# Patient Record
Sex: Male | Born: 1955 | Race: White | Hispanic: No | Marital: Single | Smoking: Never smoker
Health system: Southern US, Community
[De-identification: ages and names within clinical notes are randomized; demographics above are authoritative.]

## PROBLEM LIST (undated history)

## (undated) DIAGNOSIS — F429 Obsessive-compulsive disorder, unspecified: Secondary | ICD-10-CM

## (undated) DIAGNOSIS — R569 Unspecified convulsions: Secondary | ICD-10-CM

## (undated) DIAGNOSIS — F32A Depression, unspecified: Secondary | ICD-10-CM

## (undated) DIAGNOSIS — E785 Hyperlipidemia, unspecified: Secondary | ICD-10-CM

## (undated) DIAGNOSIS — I10 Essential (primary) hypertension: Secondary | ICD-10-CM

## (undated) DIAGNOSIS — F419 Anxiety disorder, unspecified: Secondary | ICD-10-CM

## (undated) DIAGNOSIS — F329 Major depressive disorder, single episode, unspecified: Secondary | ICD-10-CM

## (undated) DIAGNOSIS — E119 Type 2 diabetes mellitus without complications: Secondary | ICD-10-CM

## (undated) HISTORY — DX: Hyperlipidemia, unspecified: E78.5

## (undated) HISTORY — DX: Anxiety disorder, unspecified: F41.9

## (undated) HISTORY — DX: Major depressive disorder, single episode, unspecified: F32.9

## (undated) HISTORY — DX: Depression, unspecified: F32.A

## (undated) HISTORY — DX: Type 2 diabetes mellitus without complications: E11.9

## (undated) HISTORY — PX: TONSILLECTOMY: SUR1361

---

## 2000-07-18 ENCOUNTER — Ambulatory Visit (HOSPITAL_COMMUNITY): Admission: RE | Admit: 2000-07-18 | Discharge: 2000-07-18 | Payer: Self-pay | Admitting: Neurology

## 2011-01-17 ENCOUNTER — Encounter: Payer: Self-pay | Admitting: Neurology

## 2014-06-06 ENCOUNTER — Encounter (HOSPITAL_COMMUNITY): Payer: Self-pay | Admitting: Emergency Medicine

## 2014-06-06 ENCOUNTER — Emergency Department (HOSPITAL_COMMUNITY)
Admission: EM | Admit: 2014-06-06 | Discharge: 2014-06-07 | Disposition: A | Discharge status: Home / Self Care | Payer: BC Managed Care – PPO | Attending: Emergency Medicine | Admitting: Emergency Medicine

## 2014-06-06 DIAGNOSIS — Y9389 Activity, other specified: Secondary | ICD-10-CM | POA: Insufficient documentation

## 2014-06-06 DIAGNOSIS — S0990XA Unspecified injury of head, initial encounter: Secondary | ICD-10-CM | POA: Insufficient documentation

## 2014-06-06 DIAGNOSIS — G44209 Tension-type headache, unspecified, not intractable: Secondary | ICD-10-CM

## 2014-06-06 DIAGNOSIS — Z8669 Personal history of other diseases of the nervous system and sense organs: Secondary | ICD-10-CM | POA: Insufficient documentation

## 2014-06-06 DIAGNOSIS — I10 Essential (primary) hypertension: Secondary | ICD-10-CM | POA: Insufficient documentation

## 2014-06-06 DIAGNOSIS — Y92410 Unspecified street and highway as the place of occurrence of the external cause: Secondary | ICD-10-CM | POA: Insufficient documentation

## 2014-06-06 DIAGNOSIS — Z79899 Other long term (current) drug therapy: Secondary | ICD-10-CM | POA: Insufficient documentation

## 2014-06-06 HISTORY — DX: Essential (primary) hypertension: I10

## 2014-06-06 HISTORY — DX: Unspecified convulsions: R56.9
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
## 2014-06-07 ENCOUNTER — Encounter (HOSPITAL_COMMUNITY): Payer: Self-pay | Admitting: *Deleted

## 2014-06-07 ENCOUNTER — Emergency Department (HOSPITAL_COMMUNITY): Payer: BC Managed Care – PPO

## 2014-06-07 MED ORDER — SODIUM CHLORIDE 0.9 % IV SOLN: 1000.0000 mL | INTRAVENOUS | Status: DC

## 2014-06-07 MED ORDER — METOCLOPRAMIDE HCL 5 MG/ML IJ SOLN
10.0000 mg | Freq: Once | INTRAMUSCULAR | Status: AC
  Administered 2014-06-07: 10 mg via INTRAVENOUS
  Filled 2014-06-07: qty 2

## 2014-06-07 MED ORDER — SODIUM CHLORIDE 0.9 % IV SOLN
1000.0000 mL | Freq: Once | INTRAVENOUS | Status: AC
  Administered 2014-06-07: 1000 mL via INTRAVENOUS

## 2014-06-07 MED ORDER — TRAMADOL HCL 50 MG PO TABS: 50.0000 mg | ORAL_TABLET | Freq: Four times a day (QID) | ORAL | Status: DC | PRN

## 2014-06-07 MED ORDER — DIPHENHYDRAMINE HCL 50 MG/ML IJ SOLN
25.0000 mg | Freq: Once | INTRAMUSCULAR | Status: AC
  Administered 2014-06-07: 25 mg via INTRAVENOUS
  Filled 2014-06-07: qty 1

## 2014-06-07 NOTE — ED Notes (Signed)
Removed IV. Saline Locked, Clean Dry and Intact.
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
## 2014-06-07 NOTE — ED Notes (Signed)
Pt refusing wheelchair at discharge.
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
## 2015-07-25 ENCOUNTER — Ambulatory Visit (INDEPENDENT_AMBULATORY_CARE_PROVIDER_SITE_OTHER): Payer: 59 | Admitting: Licensed Clinical Social Worker

## 2015-07-25 DIAGNOSIS — F4323 Adjustment disorder with mixed anxiety and depressed mood: Secondary | ICD-10-CM

## 2015-08-15 ENCOUNTER — Ambulatory Visit (INDEPENDENT_AMBULATORY_CARE_PROVIDER_SITE_OTHER): Payer: 59 | Admitting: Licensed Clinical Social Worker

## 2015-08-15 DIAGNOSIS — F4323 Adjustment disorder with mixed anxiety and depressed mood: Secondary | ICD-10-CM | POA: Diagnosis not present

## 2015-09-03 ENCOUNTER — Ambulatory Visit (INDEPENDENT_AMBULATORY_CARE_PROVIDER_SITE_OTHER): Payer: 59 | Admitting: Licensed Clinical Social Worker

## 2015-09-03 DIAGNOSIS — F4323 Adjustment disorder with mixed anxiety and depressed mood: Secondary | ICD-10-CM | POA: Diagnosis not present

## 2015-09-26 ENCOUNTER — Ambulatory Visit (INDEPENDENT_AMBULATORY_CARE_PROVIDER_SITE_OTHER): Payer: 59 | Admitting: Licensed Clinical Social Worker

## 2015-09-26 DIAGNOSIS — F4323 Adjustment disorder with mixed anxiety and depressed mood: Secondary | ICD-10-CM | POA: Diagnosis not present

## 2015-10-13 ENCOUNTER — Ambulatory Visit: Payer: Self-pay | Admitting: Licensed Clinical Social Worker

## 2016-02-20 ENCOUNTER — Encounter (HOSPITAL_COMMUNITY): Payer: Self-pay | Admitting: Emergency Medicine

## 2016-02-20 ENCOUNTER — Inpatient Hospital Stay (HOSPITAL_COMMUNITY)
Admission: EM | Admit: 2016-02-20 | Discharge: 2016-02-22 | DRG: 101 | Disposition: A | Discharge status: Home / Self Care | Payer: BLUE CROSS/BLUE SHIELD | Attending: Internal Medicine | Admitting: Internal Medicine

## 2016-02-20 DIAGNOSIS — G40919 Epilepsy, unspecified, intractable, without status epilepticus: Secondary | ICD-10-CM | POA: Diagnosis present

## 2016-02-20 DIAGNOSIS — E119 Type 2 diabetes mellitus without complications: Secondary | ICD-10-CM | POA: Diagnosis present

## 2016-02-20 DIAGNOSIS — R569 Unspecified convulsions: Secondary | ICD-10-CM | POA: Diagnosis not present

## 2016-02-20 DIAGNOSIS — G40909 Epilepsy, unspecified, not intractable, without status epilepticus: Principal | ICD-10-CM

## 2016-02-20 DIAGNOSIS — F329 Major depressive disorder, single episode, unspecified: Secondary | ICD-10-CM | POA: Diagnosis present

## 2016-02-20 DIAGNOSIS — Z7984 Long term (current) use of oral hypoglycemic drugs: Secondary | ICD-10-CM

## 2016-02-20 DIAGNOSIS — E785 Hyperlipidemia, unspecified: Secondary | ICD-10-CM | POA: Diagnosis present

## 2016-02-20 DIAGNOSIS — I10 Essential (primary) hypertension: Secondary | ICD-10-CM | POA: Diagnosis present

## 2016-02-20 DIAGNOSIS — Z79899 Other long term (current) drug therapy: Secondary | ICD-10-CM

## 2016-02-20 LAB — CBC WITH DIFFERENTIAL/PLATELET
BASOS PCT: 0 %
Basophils Absolute: 0 10*3/uL (ref 0.0–0.1)
Eosinophils Absolute: 0.1 10*3/uL (ref 0.0–0.7)
Eosinophils Relative: 1 %
HEMATOCRIT: 38.5 % — AB (ref 39.0–52.0)
Hemoglobin: 13.6 g/dL (ref 13.0–17.0)
LYMPHS PCT: 11 %
Lymphs Abs: 0.9 10*3/uL (ref 0.7–4.0)
MCH: 34 pg (ref 26.0–34.0)
MCHC: 35.3 g/dL (ref 30.0–36.0)
MCV: 96.3 fL (ref 78.0–100.0)
MONOS PCT: 9 %
Monocytes Absolute: 0.8 10*3/uL (ref 0.1–1.0)
NEUTROS ABS: 6.3 10*3/uL (ref 1.7–7.7)
NEUTROS PCT: 79 %
Platelets: 173 10*3/uL (ref 150–400)
RBC: 4 MIL/uL — ABNORMAL LOW (ref 4.22–5.81)
RDW: 13.1 % (ref 11.5–15.5)
WBC: 8 10*3/uL (ref 4.0–10.5)

## 2016-02-20 LAB — BASIC METABOLIC PANEL
Anion gap: 10 (ref 5–15)
BUN: 17 mg/dL (ref 6–20)
CHLORIDE: 99 mmol/L — AB (ref 101–111)
CO2: 23 mmol/L (ref 22–32)
Calcium: 8.6 mg/dL — ABNORMAL LOW (ref 8.9–10.3)
Creatinine, Ser: 0.66 mg/dL (ref 0.61–1.24)
GFR calc non Af Amer: >60 mL/min (ref >60)
GLUCOSE: 93 mg/dL (ref 65–99)
Potassium: 3.7 mmol/L (ref 3.5–5.1)
Sodium: 132 mmol/L — ABNORMAL LOW (ref 135–145)

## 2016-02-20 LAB — PHENYTOIN LEVEL, TOTAL: Phenytoin Lvl: 6.9 ug/mL — ABNORMAL LOW (ref 10.0–20.0)

## 2016-02-20 LAB — VALPROIC ACID LEVEL

## 2016-02-20 MED ORDER — SODIUM CHLORIDE 0.9 % IV SOLN
1000.0000 mg | Freq: Once | INTRAVENOUS | Status: AC
  Administered 2016-02-21: 1000 mg via INTRAVENOUS
  Filled 2016-02-20: qty 20

## 2016-02-20 MED ORDER — LORAZEPAM 2 MG/ML IJ SOLN
1.0000 mg | Freq: Once | INTRAMUSCULAR | Status: AC
  Administered 2016-02-20: 1 mg via INTRAVENOUS

## 2016-02-20 MED ORDER — LORAZEPAM 2 MG/ML IJ SOLN
INTRAMUSCULAR | Status: AC
  Filled 2016-02-20: qty 1
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
## 2016-02-21 ENCOUNTER — Emergency Department (HOSPITAL_COMMUNITY): Payer: BLUE CROSS/BLUE SHIELD

## 2016-02-21 ENCOUNTER — Encounter (HOSPITAL_COMMUNITY): Payer: Self-pay

## 2016-02-21 DIAGNOSIS — E119 Type 2 diabetes mellitus without complications: Secondary | ICD-10-CM | POA: Diagnosis present

## 2016-02-21 DIAGNOSIS — E785 Hyperlipidemia, unspecified: Secondary | ICD-10-CM

## 2016-02-21 DIAGNOSIS — G40909 Epilepsy, unspecified, not intractable, without status epilepticus: Secondary | ICD-10-CM | POA: Diagnosis present

## 2016-02-21 DIAGNOSIS — F329 Major depressive disorder, single episode, unspecified: Secondary | ICD-10-CM | POA: Diagnosis present

## 2016-02-21 DIAGNOSIS — G40919 Epilepsy, unspecified, intractable, without status epilepticus: Secondary | ICD-10-CM | POA: Diagnosis not present

## 2016-02-21 DIAGNOSIS — R569 Unspecified convulsions: Secondary | ICD-10-CM | POA: Diagnosis present

## 2016-02-21 DIAGNOSIS — Z79899 Other long term (current) drug therapy: Secondary | ICD-10-CM | POA: Diagnosis not present

## 2016-02-21 DIAGNOSIS — I10 Essential (primary) hypertension: Secondary | ICD-10-CM | POA: Diagnosis present

## 2016-02-21 DIAGNOSIS — Z7984 Long term (current) use of oral hypoglycemic drugs: Secondary | ICD-10-CM | POA: Diagnosis not present

## 2016-02-21 LAB — COMPREHENSIVE METABOLIC PANEL
ALK PHOS: 49 U/L (ref 38–126)
ALT: 22 U/L (ref 17–63)
AST: 30 U/L (ref 15–41)
Albumin: 3.7 g/dL (ref 3.5–5.0)
Anion gap: 9 (ref 5–15)
BILIRUBIN TOTAL: 0.8 mg/dL (ref 0.3–1.2)
BUN: 13 mg/dL (ref 6–20)
CALCIUM: 8.3 mg/dL — AB (ref 8.9–10.3)
CO2: 27 mmol/L (ref 22–32)
CREATININE: 0.65 mg/dL (ref 0.61–1.24)
Chloride: 98 mmol/L — ABNORMAL LOW (ref 101–111)
Glucose, Bld: 117 mg/dL — ABNORMAL HIGH (ref 65–99)
Potassium: 3.4 mmol/L — ABNORMAL LOW (ref 3.5–5.1)
Sodium: 134 mmol/L — ABNORMAL LOW (ref 135–145)
TOTAL PROTEIN: 6.2 g/dL — AB (ref 6.5–8.1)

## 2016-02-21 LAB — RAPID URINE DRUG SCREEN, HOSP PERFORMED
Amphetamines: NOT DETECTED
BARBITURATES: NOT DETECTED
Benzodiazepines: POSITIVE — AB
COCAINE: NOT DETECTED
Opiates: NOT DETECTED
TETRAHYDROCANNABINOL: NOT DETECTED

## 2016-02-21 LAB — CBC
HCT: 37.6 % — ABNORMAL LOW (ref 39.0–52.0)
Hemoglobin: 13.1 g/dL (ref 13.0–17.0)
MCH: 34 pg (ref 26.0–34.0)
MCHC: 34.8 g/dL (ref 30.0–36.0)
MCV: 97.7 fL (ref 78.0–100.0)
PLATELETS: 170 10*3/uL (ref 150–400)
RBC: 3.85 MIL/uL — AB (ref 4.22–5.81)
RDW: 13 % (ref 11.5–15.5)
WBC: 8 10*3/uL (ref 4.0–10.5)

## 2016-02-21 LAB — ALBUMIN: Albumin: 3.9 g/dL (ref 3.5–5.0)

## 2016-02-21 LAB — GLUCOSE, CAPILLARY
GLUCOSE-CAPILLARY: 115 mg/dL — AB (ref 65–99)
GLUCOSE-CAPILLARY: 143 mg/dL — AB (ref 65–99)
GLUCOSE-CAPILLARY: 143 mg/dL — AB (ref 65–99)
GLUCOSE-CAPILLARY: 115 mg/dL — AB (ref 65–99)
Glucose-Capillary: 126 mg/dL — ABNORMAL HIGH (ref 65–99)

## 2016-02-21 MED ORDER — INSULIN ASPART 100 UNIT/ML ~~LOC~~ SOLN
0.0000 [IU] | SUBCUTANEOUS | Status: DC
  Administered 2016-02-21 (×2): 1 [IU] via SUBCUTANEOUS

## 2016-02-21 MED ORDER — HYDROCHLOROTHIAZIDE 12.5 MG PO CAPS
12.5000 mg | ORAL_CAPSULE | Freq: Every day | ORAL | Status: DC
  Administered 2016-02-21 – 2016-02-22 (×2): 12.5 mg via ORAL
  Filled 2016-02-21 (×2): qty 1

## 2016-02-21 MED ORDER — FENOFIBRATE 160 MG PO TABS
160.0000 mg | ORAL_TABLET | Freq: Every day | ORAL | Status: DC
  Administered 2016-02-21 – 2016-02-22 (×2): 160 mg via ORAL
  Filled 2016-02-21 (×2): qty 1

## 2016-02-21 MED ORDER — ACETAMINOPHEN 650 MG RE SUPP: 650.0000 mg | Freq: Four times a day (QID) | RECTAL | Status: DC | PRN

## 2016-02-21 MED ORDER — ONDANSETRON HCL 4 MG PO TABS: 4.0000 mg | ORAL_TABLET | Freq: Four times a day (QID) | ORAL | Status: DC | PRN

## 2016-02-21 MED ORDER — PHENYTOIN SODIUM EXTENDED 100 MG PO CAPS
300.0000 mg | ORAL_CAPSULE | Freq: Every morning | ORAL | Status: DC
  Administered 2016-02-21 – 2016-02-22 (×2): 300 mg via ORAL
  Filled 2016-02-21 (×2): qty 3

## 2016-02-21 MED ORDER — VALPROATE SODIUM 500 MG/5ML IV SOLN
1000.0000 mg | Freq: Once | INTRAVENOUS | Status: AC
  Administered 2016-02-21: 1000 mg via INTRAVENOUS
  Filled 2016-02-21: qty 10

## 2016-02-21 MED ORDER — BIOTIN 1 MG PO CAPS: 1.0000 mg | ORAL_CAPSULE | Freq: Every day | ORAL | Status: DC

## 2016-02-21 MED ORDER — PHENYTOIN SODIUM EXTENDED 100 MG PO CAPS
200.0000 mg | ORAL_CAPSULE | Freq: Every day | ORAL | Status: DC
  Filled 2016-02-21 (×2): qty 2

## 2016-02-21 MED ORDER — PHENYTOIN SODIUM 50 MG/ML IJ SOLN
100.0000 mg | Freq: Three times a day (TID) | INTRAMUSCULAR | Status: DC
  Filled 2016-02-21 (×4): qty 2

## 2016-02-21 MED ORDER — KCL IN DEXTROSE-NACL 20-5-0.45 MEQ/L-%-% IV SOLN
INTRAVENOUS | Status: DC
Start: 2016-02-21 — End: 2016-02-21
  Administered 2016-02-21: 04:00:00 via INTRAVENOUS
  Filled 2016-02-21 (×2): qty 1000

## 2016-02-21 MED ORDER — GABAPENTIN 300 MG PO CAPS
300.0000 mg | ORAL_CAPSULE | Freq: Three times a day (TID) | ORAL | Status: DC
  Administered 2016-02-21 – 2016-02-22 (×3): 300 mg via ORAL
  Filled 2016-02-21 (×6): qty 1

## 2016-02-21 MED ORDER — ENOXAPARIN SODIUM 40 MG/0.4ML ~~LOC~~ SOLN
40.0000 mg | SUBCUTANEOUS | Status: DC
  Administered 2016-02-21: 40 mg via SUBCUTANEOUS
  Filled 2016-02-21 (×2): qty 0.4

## 2016-02-21 MED ORDER — BISACODYL 10 MG RE SUPP: 10.0000 mg | Freq: Every day | RECTAL | Status: DC | PRN

## 2016-02-21 MED ORDER — IRBESARTAN 75 MG PO TABS
75.0000 mg | ORAL_TABLET | Freq: Every day | ORAL | Status: DC
  Administered 2016-02-21 – 2016-02-22 (×2): 75 mg via ORAL
  Filled 2016-02-21 (×2): qty 1

## 2016-02-21 MED ORDER — ONDANSETRON HCL 4 MG/2ML IJ SOLN
4.0000 mg | Freq: Four times a day (QID) | INTRAMUSCULAR | Status: DC | PRN
  Administered 2016-02-21 (×2): 4 mg via INTRAVENOUS
  Filled 2016-02-21 (×2): qty 2

## 2016-02-21 MED ORDER — AMLODIPINE BESYLATE 5 MG PO TABS
5.0000 mg | ORAL_TABLET | Freq: Every day | ORAL | Status: DC
  Administered 2016-02-22: 5 mg via ORAL
  Filled 2016-02-21 (×2): qty 1

## 2016-02-21 MED ORDER — VALACYCLOVIR HCL 500 MG PO TABS
1000.0000 mg | ORAL_TABLET | Freq: Every day | ORAL | Status: DC
  Administered 2016-02-21 – 2016-02-22 (×2): 1000 mg via ORAL
  Filled 2016-02-21 (×2): qty 2

## 2016-02-21 MED ORDER — EZETIMIBE 10 MG PO TABS
5.0000 mg | ORAL_TABLET | Freq: Every day | ORAL | Status: DC
  Administered 2016-02-21 – 2016-02-22 (×2): 5 mg via ORAL
  Filled 2016-02-21 (×2): qty 0.5

## 2016-02-21 MED ORDER — DIVALPROEX SODIUM 125 MG PO CSDR
500.0000 mg | DELAYED_RELEASE_CAPSULE | Freq: Two times a day (BID) | ORAL | Status: DC
  Administered 2016-02-21 (×2): 500 mg via ORAL
  Filled 2016-02-21 (×5): qty 4

## 2016-02-21 MED ORDER — ALUM & MAG HYDROXIDE-SIMETH 200-200-20 MG/5ML PO SUSP: 30.0000 mL | Freq: Four times a day (QID) | ORAL | Status: DC | PRN

## 2016-02-21 MED ORDER — ACETAMINOPHEN 325 MG PO TABS
650.0000 mg | ORAL_TABLET | Freq: Four times a day (QID) | ORAL | Status: DC | PRN
  Administered 2016-02-21: 650 mg via ORAL
  Filled 2016-02-21: qty 2

## 2016-02-21 MED ORDER — SODIUM CHLORIDE 0.9 % IV SOLN
850.0000 mg | Freq: Once | INTRAVENOUS | Status: AC
  Administered 2016-02-21: 850 mg via INTRAVENOUS
  Filled 2016-02-21: qty 17

## 2016-02-21 MED ORDER — PIOGLITAZONE HCL 30 MG PO TABS
30.0000 mg | ORAL_TABLET | Freq: Every day | ORAL | Status: DC
  Filled 2016-02-21 (×2): qty 1

## 2016-02-21 MED ORDER — HYDROCHLOROTHIAZIDE 25 MG PO TABS
12.5000 mg | ORAL_TABLET | Freq: Every day | ORAL | Status: DC
  Filled 2016-02-21: qty 0.5

## 2016-02-21 MED ORDER — HYDROCODONE-ACETAMINOPHEN 5-325 MG PO TABS: 1.0000 | ORAL_TABLET | ORAL | Status: DC | PRN

## 2016-02-21 MED ORDER — FLUOXETINE HCL 20 MG PO CAPS
40.0000 mg | ORAL_CAPSULE | Freq: Every day | ORAL | Status: DC
  Administered 2016-02-21 – 2016-02-22 (×2): 40 mg via ORAL
  Filled 2016-02-21 (×2): qty 2

## 2016-02-21 NOTE — Progress Notes (Signed)
MEDICATION RELATED CONSULT NOTE - INITIAL   Pharmacy Consult for Phenytoin Indication: Seizures  No Known Allergies  Patient Measurements:   Patient reported weight = 180 pounds (81.8 kg)  Vital Signs: Temp: 98.1 F (36.7 C) (02/24 2132) Temp Source: Oral (02/24 2132) BP: 108/73 mmHg (02/25 0001) Pulse Rate: 96 (02/25 0001) Intake/Output from previous day:   Intake/Output from this shift:    Labs:  Recent Labs  02/20/16 2214 02/20/16 2221  WBC 8.0  --   HGB 13.6  --   HCT 38.5*  --   PLT 173  --   CREATININE 0.66  --   ALBUMIN  --  3.9   CrCl cannot be calculated (Unknown ideal weight.).   Microbiology: No results found for this or any previous visit (from the past 720 hour(s)).  Medical History: Past Medical History  Diagnosis Date  . Hypertension   . Seizures (HCC)     Medications:  Scheduled:   Infusions:  . phenytoin (DILANTIN) IV 1,000 mg (02/21/16 0031)  . valproate sodium      Assessment:  60 yr male presents with reports of seizure activity.  H/O seizures noted 10 years ago and was on Phenytoin at that time.  At this time currently on no anti-seizure medication.  In remote history, patient has also been on Depakote.  Phenytoin (total) level = 6.9; Albumin = 3.9 ==> corrected Phenytoin level = 7.8  Goal of Therapy:  Phenytoin total level : 10-20 mcg/ml  Plan:   Phenytoin  IV x 1 loading dose followed by  IV q8h  Plan to check phenytoin total in 2-3 days   MD also has dosed Valproate  IV x 1  Kyah Buesing, Joselyn Glassman, PharmD 02/21/2016,12:35 AM

## 2016-02-21 NOTE — H&P (Addendum)
[facility] History and Physical  FRAZIER BALFOUR WUJ:[id number] DOB: September 30, 1956 DOA: 02/20/2016  Referring physician: Dr. Rhunette Croft PCP: Junious Silk, MD   Chief Complaint: Seizures  HPI: Theodore Gutierrez is a 60 y.o. male with long history of seizures. History per pt's brother is that his seizures have been controlled for over 20 years on medication,then his medications were being tapered down with weaning of the dilantin and depakote doses.  Then today he developed seizures, 4 at home earlier.  Brought to ED by EMS and had one seizure here. He rec'd IV  Ativan 1 mg and then 1 gm of IV dilantin and I gm of IV Valproate. No further seizures.  Patient is very somnolent and doesn't provide any history.      Where does patient live home Can patient participate in ADLs? yes  Past Medical History  Past Medical History  Diagnosis Date  . Hypertension   . Seizures (HCC)    Past Surgical History History reviewed. No pertinent past surgical history. Family History History reviewed. No pertinent family history. Social History  reports that he has never smoked. He does not have any smokeless tobacco history on file. He reports that he does not drink alcohol. His drug history is not on file. Allergies No Known Allergies Home medications Prior to Admission medications   Medication Sig Start Date End Date Taking? Authorizing Provider  amLODipine (NORVASC) 5 MG tablet Take 5 mg by mouth daily.  02/09/16  Yes Historical Provider, MD  Biotin 1 MG CAPS Take 1 mg by mouth daily.   Yes Historical Provider, MD  Calcium Carbonate-Vitamin D (CALCIUM 500 + D PO) Take 1 tablet by mouth daily.   Yes Historical Provider, MD  OLANZapine (ZYPREXA) 5 MG tablet Take 5 mg by mouth daily. 02/18/16  Yes Historical Provider, MD  PARoxetine (PAXIL) 30 MG tablet Take 60 mg by mouth daily. Reported on 02/20/2016 02/12/16  Yes Historical Provider, MD  clonazePAM (KLONOPIN) 0.5 MG tablet take 1 tablet by mouth  every 6 hours if needed for SEVERE anxiety   (MUST LAST AT LEAST 30 DAYS) 01/25/16   Historical Provider, MD  ezetimibe (ZETIA) 10 MG tablet Take 5 mg by mouth daily.    Historical Provider, MD  fenofibrate 160 MG tablet Take 160 mg by mouth daily.  02/15/16   Historical Provider, MD  FLUoxetine (PROZAC) 40 MG capsule Take 40 mg by mouth daily. Reported on 02/20/2016 02/18/16   Historical Provider, MD  gabapentin (NEURONTIN) 300 MG capsule take 1 capsule by mouth three times a day 02/13/16   Historical Provider, MD  hydrochlorothiazide (HYDRODIURIL) 12.5 MG tablet Take 12.5 mg by mouth daily.  02/01/16   Historical Provider, MD  irbesartan (AVAPRO) 75 MG tablet Take 75 mg by mouth daily.  01/02/16   Historical Provider, MD  Multiple Vitamins-Minerals (ONE-A-DAY MENS 50+ ADVANTAGE PO) Take 1 tablet by mouth daily.    Historical Provider, MD  pioglitazone (ACTOS) 30 MG tablet Take 30 mg by mouth daily.    Historical Provider, MD  traMADol (ULTRAM) 50 MG tablet Take 1 tablet (50 mg total) by mouth every 6 (six) hours as needed. Patient not taking: Reported on 02/20/2016 06/07/14   Dione Booze, MD  valACYclovir (VALTREX) 1000 MG tablet Take 1,000 mg by mouth daily.  02/18/16   Historical Provider, MD   Liver Function Tests  Recent Labs Lab 02/20/16 2221  ALBUMIN 3.9   No results for input(s): LIPASE, AMYLASE in the last 168  hours. CBC  Recent Labs Lab 02/20/16 2214  WBC 8.0  NEUTROABS 6.3  HGB 13.6  HCT 38.5*  MCV 96.3  PLT 173   Basic Metabolic Panel  Recent Labs Lab 02/20/16 2214  NA 132*  K 3.7  CL 99*  CO2 23  GLUCOSE 93  BUN 17  CREATININE 0.66  CALCIUM 8.6*     Filed Vitals:   02/21/16 0015 02/21/16 0030 02/21/16 0121 02/21/16 0200  BP: 111/76  99/76 111/73  Pulse: 96 96 79 87  Temp:      TempSrc:      Resp: SpO2:  92% 92% 90%   Exam: Disheveled, post-ictal and sedated with Ativan, grips bilat, mumbles his name No rash, cyanosis or gangrene Sclera  anicteric, throat clear No jvd Chest clear bilat RRR no MRG ABd soft ntnd no mass or ascites GU normal MS good muscle tone/ no joint effusion Ext no LE or UE edema, no wounds or ulcers Neuro is sedated, sleeping w/o distress, moves All 4 ext spont  Head CT  IMPRESSION: 1. No acute intracranial pathology seen on CT. 2. Mild small vessel ischemic microangiopathy  Home medications:  Norvasc, biotin, calc carb, zyprexa, paxil,  Klonopin, ezetimibe, fenofibrate, Prozac, neurontin, HCTZ, irbesartan, MVI, Actos, Valtrex  Na 132  K 3.7  BUN 17 CO2 23  Creat 0.66  Alb 3.9   WBC 8k  Hb 13  plt 173  Phenytoin 6.9  Valproic acid <10    Assessment: 1  Seizure disorder - breakthrough seizures occurring during taper of long-term dilantin and depakote.  Appreciate neurology rec's , per ED MD we should per neurology >  dc Zyprexa and consider other antipsychotics as needed; ok tocontinue neurontin ; Load depakote and then transition to depakote 500 mg bid 2 HTN - cont home bp medications (norvasc/ irbesartan) 3 HL 4 Depression - on prozac vs paxil??   5 DM on Actos  Plan - see orders   DVT Prophylaxis lovenox  Code Status: full  Family Communication: none  Disposition Plan: when better    Maree Krabbe [facility] Pager [phone]  Cell [phone]  If 7PM-7AM, please contact night-coverage [email or web address] Password [facility] 02/21/2016, 2:37 AM

## 2016-02-21 NOTE — Progress Notes (Signed)
PHARMACIST - PHYSICIAN ORDER COMMUNICATION  CONCERNING: P&T Medication Policy on Herbal Medications  DESCRIPTION:  This patient's order for:  Biotin  has been noted.  This product(s) is classified as an "herbal" or natural product. Due to a lack of definitive safety studies or FDA approval, nonstandard manufacturing practices, plus the potential risk of unknown drug-drug interactions while on inpatient medications, the Pharmacy and Therapeutics Committee does not permit the use of "herbal" or natural products of this type within [place].   ACTION TAKEN: The pharmacy department is unable to verify this order at this time and your patient has been informed of this safety policy. Please reevaluate patient's clinical condition at discharge and address if the herbal or natural product(s) should be resumed at that time.  Niala Stcharles, PharmD 

## 2016-02-21 NOTE — Progress Notes (Signed)
Utilization review completed.  

## 2016-02-21 NOTE — Progress Notes (Signed)
Patient admitted after midnight.  Please see note by Dr. Arlean Hopping.  Waking up.   Records reviewed from [facility]--- patient was supposed to be on dilantin 300 q AM and 200 mg midday.  They were tapering off the depakote.   Spoke with brother who does not think he was taking dilantin as he thought he was being weaned off of that medication.  Dilantin level low   Paged neuro to discuss plan from here  Will need to follow outpatient with neuro, no driving.  Marlin Canary DO

## 2016-02-21 NOTE — ED Notes (Signed)
Pt's contact---- brother:  Kayzen Kendzierski--- tel# [phone]

## 2016-02-21 NOTE — ED Provider Notes (Signed)
CSN: [id number]     Arrival date & time 02/20/16  2115 History   First MD Initiated Contact with Patient 02/20/16 2139     Chief Complaint  Patient presents with  . Seizures     (Consider location/radiation/quality/duration/timing/severity/associated sxs/prior Treatment) Patient is a 60 y.o. male presenting with seizures. The history is provided by the patient.  Seizures Seizure activity on arrival: no   Seizure type:  Grand mal Preceding symptoms: no sensation of an aura present   Episode characteristics: abnormal movements, generalized shaking, tongue biting and unresponsiveness   Postictal symptoms: confusion   Return to baseline: yes   Severity:  Moderate Duration:  10 minutes Timing:  Once Number of seizures this episode:  4 Progression:  Worsening Context: change in medication   Context: not alcohol withdrawal   Recent head injury:  No recent head injuries PTA treatment:  None History of seizures: yes     Past Medical History  Diagnosis Date  . Hypertension   . Seizures (HCC)    History reviewed. No pertinent past surgical history. History reviewed. No pertinent family history. Social History  Substance Use Topics  . Smoking status: Never Smoker   . Smokeless tobacco: None  . Alcohol Use: No    Review of Systems  Neurological: Positive for seizures.  All other systems reviewed and are negative.     Allergies  Review of patient's allergies indicates no known allergies.  Home Medications   Prior to Admission medications   Medication Sig Start Date End Date Taking? Authorizing Provider  amLODipine (NORVASC) 5 MG tablet Take 5 mg by mouth daily.  02/09/16  Yes Historical Provider, MD  Biotin 1 MG CAPS Take 1 mg by mouth daily.   Yes Historical Provider, MD  Calcium Carbonate-Vitamin D (CALCIUM 500 + D PO) Take 1 tablet by mouth daily.   Yes Historical Provider, MD  OLANZapine (ZYPREXA) 5 MG tablet Take 5 mg by mouth daily. 02/18/16  Yes Historical  Provider, MD  PARoxetine (PAXIL) 30 MG tablet Take 60 mg by mouth daily. Reported on 02/20/2016 02/12/16  Yes Historical Provider, MD  clonazePAM (KLONOPIN) 0.5 MG tablet take 1 tablet by mouth every 6 hours if needed for SEVERE anxiety   (MUST LAST AT LEAST 30 DAYS) 01/25/16   Historical Provider, MD  ezetimibe (ZETIA) 10 MG tablet Take 5 mg by mouth daily.    Historical Provider, MD  fenofibrate 160 MG tablet Take 160 mg by mouth daily.  02/15/16   Historical Provider, MD  FLUoxetine (PROZAC) 40 MG capsule Take 40 mg by mouth daily. Reported on 02/20/2016 02/18/16   Historical Provider, MD  gabapentin (NEURONTIN) 300 MG capsule take 1 capsule by mouth three times a day 02/13/16   Historical Provider, MD  hydrochlorothiazide (HYDRODIURIL) 12.5 MG tablet Take 12.5 mg by mouth daily.  02/01/16   Historical Provider, MD  irbesartan (AVAPRO) 75 MG tablet Take 75 mg by mouth daily.  01/02/16   Historical Provider, MD  Multiple Vitamins-Minerals (ONE-A-DAY MENS 50+ ADVANTAGE PO) Take 1 tablet by mouth daily.    Historical Provider, MD  pioglitazone (ACTOS) 30 MG tablet Take 30 mg by mouth daily.    Historical Provider, MD  traMADol (ULTRAM) 50 MG tablet Take 1 tablet (50 mg total) by mouth every 6 (six) hours as needed. Patient not taking: Reported on 02/20/2016 06/07/14   Dione Booze, MD  valACYclovir (VALTREX) 1000 MG tablet Take 1,000 mg by mouth daily.  02/18/16   Historical Provider,  MD   BP 99/76 mmHg  Pulse 79  Temp(Src) 98.1 F (36.7 C) (Oral)  Resp 18  SpO2 92% Physical Exam  Constitutional: He is oriented to person, place, and time. He appears well-developed.  HENT:  Head: Atraumatic.  L side tongue laceration  Neck: Neck supple.  Cardiovascular: Normal rate.   Pulmonary/Chest: Effort normal.  Neurological: He is alert and oriented to person, place, and time. No cranial nerve deficit. Coordination normal.  Skin: Skin is warm.  Nursing note and vitals reviewed.   ED Course  Procedures  (including critical care time) Labs Review Labs Reviewed  CBC WITH DIFFERENTIAL/PLATELET - Abnormal; Notable for the following:    RBC 4.00 (*)    HCT 38.5 (*)    All other components within normal limits  BASIC METABOLIC PANEL - Abnormal; Notable for the following:    Sodium 132 (*)    Chloride 99 (*)    Calcium 8.6 (*)    All other components within normal limits  PHENYTOIN LEVEL, TOTAL - Abnormal; Notable for the following:    Phenytoin Lvl 6.9 (*)    All other components within normal limits  VALPROIC ACID LEVEL - Abnormal; Notable for the following:    Valproic Acid Lvl <10 (*)    All other components within normal limits  ALBUMIN  URINE RAPID DRUG SCREEN, HOSP PERFORMED    Imaging Review Ct Head Wo Contrast  02/21/2016  CLINICAL DATA:  Status post recent seizure. Patient became unresponsive. Initial encounter. EXAM: CT HEAD WITHOUT CONTRAST TECHNIQUE: Contiguous axial images were obtained from the base of the skull through the vertex without intravenous contrast. COMPARISON:  CT of the head performed 06/07/2014 FINDINGS: There is no evidence of acute infarction, mass lesion, or intra- or extra-axial hemorrhage on CT. Mild periventricular white matter change may reflect small vessel ischemic microangiopathy. The posterior fossa, including the cerebellum, brainstem and fourth ventricle, is within normal limits. The third and lateral ventricles, and basal ganglia are unremarkable in appearance. The cerebral hemispheres are symmetric in appearance, with normal gray-white differentiation. No mass effect or midline shift is seen. There is no evidence of fracture; visualized osseous structures are unremarkable in appearance. The orbits are within normal limits. The paranasal sinuses and mastoid air cells are well-aerated. No significant soft tissue abnormalities are seen. IMPRESSION: 1. No acute intracranial pathology seen on CT. 2. Mild small vessel ischemic microangiopathy. Electronically  Signed   By: Roanna Raider M.D.   On: 02/21/2016 01:13   I have personally reviewed and evaluated these images and lab results as part of my medical decision-making.   EKG Interpretation None      MDM   Final diagnoses:  Seizures (HCC)   Pt comes in with 4 episodes of seizures. Seizure was inc control for 20 years until today. Pt was started on zyprexa 2 days ago, gabapentin 1 month ago. Pt had told me that he is taking dilantin and depakote (he was slightly post ictal) - but the brother, who is very close to the patient, reports that pt might be off of the meds, as he had seen neurologist few months ago and had talked about plans of slowly being weaned off of the meds.  Pt had another episode of seizure in the ER, and is no longer to agree or dispute the brother's side of story. Spoke with Dr. Roseanne Reno - he recommends 500 mg bid depakote, d/c zyprexa and load 1 gram of depakote now.    Eva Vallee,  MD 02/21/16 [phone]

## 2016-02-22 LAB — GLUCOSE, CAPILLARY
Glucose-Capillary: 102 mg/dL — ABNORMAL HIGH (ref 65–99)
Glucose-Capillary: 105 mg/dL — ABNORMAL HIGH (ref 65–99)
Glucose-Capillary: 119 mg/dL — ABNORMAL HIGH (ref 65–99)

## 2016-02-22 LAB — PHENYTOIN LEVEL, TOTAL: Phenytoin Lvl: 17.7 ug/mL (ref 10.0–20.0)

## 2016-02-22 MED ORDER — PHENYTOIN SODIUM EXTENDED 100 MG PO CAPS: 200.0000 mg | ORAL_CAPSULE | Freq: Two times a day (BID) | ORAL | Status: AC

## 2016-02-22 NOTE — Discharge Summary (Signed)
Physician Discharge Summary  Theodore Gutierrez ZOX:[id number] DOB: 1956-11-23 DOA: 02/20/2016  PCP: Junious Silk, MD  Admit date: 02/20/2016 Discharge date: 02/22/2016  Time spent: 35 minutes  Recommendations for Outpatient Follow-up:  1. Follow up at [facility] with neuro within 1 month 2. No driving until ok with neuro 3. Brother to help with mediations-- patient was not taking dilantin, says he thought he was supposed to have stopped   Discharge Diagnoses:  Principal Problem:   Breakthrough seizure (HCC) Active Problems:   HTN (hypertension)   Hyperlipemia   Seizure disorder (HCC)   Seizures (HCC)   Discharge Condition: improved  Diet recommendation: cardiac  Filed Weights   02/21/16 0302 02/22/16 0556  Weight: 85.3 kg (188 lb 0.8 oz) 84.7 kg (186 lb 11.7 oz)    History of present illness:  Theodore Gutierrez is a 60 y.o. male with long history of seizures. History per pt's brother is that his seizures have been controlled for over 20 years on medication,then his medications were being tapered down with weaning of the dilantin and depakote doses. Then today he developed seizures, 4 at home earlier. Brought to ED by EMS and had one seizure here. He rec'd IV Ativan 1 mg and then 1 gm of IV dilantin and I gm of IV Valproate. No further seizures. Patient is very somnolent and doesn't provide any history  Hospital Course:  Seizures-- patient was not taking dilantin as he was supposed to, he says he would forget...  Loaded and level re-checked -continue home does -[facility] up at [facility]   Procedures:    Consultations:    Discharge Exam: Filed Vitals:   02/21/16 2111 02/22/16 0556  BP: 114/64 109/65  Pulse:  82  Temp: 98.4 F (36.9 C) 97.7 F (36.5 C)  Resp: 16 18    General: awake, NAD   Discharge Instructions   Discharge Instructions    Diet - low sodium heart healthy    Complete by:  As directed      Discharge instructions    Complete by:  As directed   No driving F/up with Dr. Doreatha Martin or PA 2-3 weeks Would use pill box to lay out seizure medications for the week so you don't forget     Increase activity slowly    Complete by:  As directed           Current Discharge Medication List    CONTINUE these medications which have NOT CHANGED   Details  Calcium Carbonate-Vitamin D (CALCIUM 500 + D PO) Take 1 tablet by mouth daily.    clonazePAM (KLONOPIN) 0.5 MG tablet take 1 tablet by mouth every 6 hours if needed for SEVERE anxiety   (MUST LAST AT LEAST 30 DAYS) Refills: 0    fenofibrate 160 MG tablet Take 160 mg by mouth daily.  Refills: 0    FLUoxetine (PROZAC) 40 MG capsule Take 40 mg by mouth daily. Reported on 02/20/2016 Refills: 0    gabapentin (NEURONTIN) 300 MG capsule take 1 capsule by mouth three times a day Refills: 0    irbesartan (AVAPRO) 75 MG tablet Take 75 mg by mouth daily.  Refills: 0    Multiple Vitamins-Minerals (ONE-A-DAY MENS 50+ ADVANTAGE PO) Take 1 tablet by mouth daily.    phenytoin (DILANTIN) 100 MG ER capsule Take 200-300 mg by mouth 2 (two) times daily. 300 mg in the am and 200 mg mid day    pioglitazone (ACTOS) 30 MG tablet Take 15 mg by mouth  daily.     pravastatin (PRAVACHOL) 80 MG tablet Take 80 mg by mouth at bedtime.    topiramate (TOPAMAX) 25 MG tablet Take 25 mg by mouth 2 (two) times daily.    valACYclovir (VALTREX) 1000 MG tablet Take 1,000 mg by mouth daily.  Refills: 0      STOP taking these medications     amLODipine (NORVASC) 5 MG tablet      hydrochlorothiazide (HYDRODIURIL) 12.5 MG tablet      OLANZapine (ZYPREXA) 5 MG tablet      PARoxetine (PAXIL) 30 MG tablet      traMADol (ULTRAM) 50 MG tablet      ezetimibe (ZETIA) 10 MG tablet        No Known Allergies Follow-up Information    Follow up with ALTHEIMER,MICHAEL D, MD In 1 week.   Specialty:  Endocrinology   Contact information:   [facility] [place] [address] [phone]       Follow up with  Monia Sabal, MD In 2 weeks.   Specialty:  Psychiatry   Contact information:   [facility] [place] [address] [phone]        The results of significant diagnostics from this hospitalization (including imaging, microbiology, ancillary and laboratory) are listed below for reference.    Significant Diagnostic Studies: Ct Head Wo Contrast  02/21/2016  CLINICAL DATA:  Status post recent seizure. Patient became unresponsive. Initial encounter. EXAM: CT HEAD WITHOUT CONTRAST TECHNIQUE: Contiguous axial images were obtained from the base of the skull through the vertex without intravenous contrast. COMPARISON:  CT of the head performed 06/07/2014 FINDINGS: There is no evidence of acute infarction, mass lesion, or intra- or extra-axial hemorrhage on CT. Mild periventricular white matter change may reflect small vessel ischemic microangiopathy. The posterior fossa, including the cerebellum, brainstem and fourth ventricle, is within normal limits. The third and lateral ventricles, and basal ganglia are unremarkable in appearance. The cerebral hemispheres are symmetric in appearance, with normal gray-white differentiation. No mass effect or midline shift is seen. There is no evidence of fracture; visualized osseous structures are unremarkable in appearance. The orbits are within normal limits. The paranasal sinuses and mastoid air cells are well-aerated. No significant soft tissue abnormalities are seen. IMPRESSION: 1. No acute intracranial pathology seen on CT. 2. Mild small vessel ischemic microangiopathy. Electronically Signed   By: Roanna Raider M.D.   On: 02/21/2016 01:13    Microbiology: No results found for this or any previous visit (from the past 240 hour(s)).   Labs: Basic Metabolic Panel:  Recent Labs Lab 02/20/16 2214 02/21/16 0546  NA 132* 134*  K 3.7 3.4*  CL 99* 98*  CO2 23 27  GLUCOSE 93 117*  BUN 17 13  CREATININE 0.66 0.65  CALCIUM 8.6* 8.3*   Liver Function  Tests:  Recent Labs Lab 02/20/16 2221 02/21/16 0546  AST  --  30  ALT  --  22  ALKPHOS  --  49  BILITOT  --  0.8  PROT  --  6.2*  ALBUMIN 3.9 3.7   No results for input(s): LIPASE, AMYLASE in the last 168 hours. No results for input(s): AMMONIA in the last 168 hours. CBC:  Recent Labs Lab 02/20/16 2214 02/21/16 0546  WBC 8.0 8.0  NEUTROABS 6.3  --   HGB 13.6 13.1  HCT 38.5* 37.6*  MCV 96.3 97.7  PLT 173 170   Cardiac Enzymes: No results for input(s): CKTOTAL, CKMB, CKMBINDEX, TROPONINI in the  last 168 hours. BNP: BNP (last 3 results) No results for input(s): BNP in the last 8760 hours.  ProBNP (last 3 results) No results for input(s): PROBNP in the last 8760 hours.  CBG:  Recent Labs Lab 02/21/16 1648 02/21/16 1954 02/22/16 0008 02/22/16 0430 02/22/16 0748  GLUCAP 115* 126* 119* 102* 105*       Signed:  Anaja Monts U Allyse Fregeau DO.  [facility] 02/22/2016, 8:53 AM

## 2016-02-22 NOTE — Progress Notes (Signed)
Went over d/c instructions, including all current medications with both patient and brother.  Both verbalized understanding.  Patient left via w/c with brother and his personal vehicle.

## 2016-06-23 ENCOUNTER — Ambulatory Visit (INDEPENDENT_AMBULATORY_CARE_PROVIDER_SITE_OTHER): Payer: BLUE CROSS/BLUE SHIELD | Admitting: Licensed Clinical Social Worker

## 2016-06-23 DIAGNOSIS — F429 Obsessive-compulsive disorder, unspecified: Secondary | ICD-10-CM | POA: Diagnosis not present

## 2016-08-16 ENCOUNTER — Ambulatory Visit (INDEPENDENT_AMBULATORY_CARE_PROVIDER_SITE_OTHER): Payer: BLUE CROSS/BLUE SHIELD | Admitting: Licensed Clinical Social Worker

## 2016-08-16 DIAGNOSIS — F429 Obsessive-compulsive disorder, unspecified: Secondary | ICD-10-CM | POA: Diagnosis not present

## 2016-09-01 ENCOUNTER — Ambulatory Visit (INDEPENDENT_AMBULATORY_CARE_PROVIDER_SITE_OTHER): Payer: BLUE CROSS/BLUE SHIELD | Admitting: Licensed Clinical Social Worker

## 2016-09-01 DIAGNOSIS — F429 Obsessive-compulsive disorder, unspecified: Secondary | ICD-10-CM | POA: Diagnosis not present

## 2016-10-04 ENCOUNTER — Ambulatory Visit (INDEPENDENT_AMBULATORY_CARE_PROVIDER_SITE_OTHER): Payer: BLUE CROSS/BLUE SHIELD | Admitting: Licensed Clinical Social Worker

## 2016-10-04 DIAGNOSIS — F429 Obsessive-compulsive disorder, unspecified: Secondary | ICD-10-CM | POA: Diagnosis not present

## 2016-10-18 ENCOUNTER — Ambulatory Visit (INDEPENDENT_AMBULATORY_CARE_PROVIDER_SITE_OTHER): Payer: BLUE CROSS/BLUE SHIELD | Admitting: Licensed Clinical Social Worker

## 2016-10-18 DIAGNOSIS — F429 Obsessive-compulsive disorder, unspecified: Secondary | ICD-10-CM

## 2016-10-26 ENCOUNTER — Ambulatory Visit: Payer: BLUE CROSS/BLUE SHIELD | Admitting: Licensed Clinical Social Worker

## 2016-11-02 ENCOUNTER — Ambulatory Visit: Payer: BLUE CROSS/BLUE SHIELD | Admitting: Licensed Clinical Social Worker

## 2016-11-25 ENCOUNTER — Encounter: Payer: Self-pay | Admitting: Internal Medicine

## 2016-11-25 ENCOUNTER — Ambulatory Visit (INDEPENDENT_AMBULATORY_CARE_PROVIDER_SITE_OTHER): Payer: BLUE CROSS/BLUE SHIELD | Admitting: Internal Medicine

## 2016-11-25 DIAGNOSIS — R300 Dysuria: Secondary | ICD-10-CM

## 2016-11-25 DIAGNOSIS — I10 Essential (primary) hypertension: Secondary | ICD-10-CM

## 2016-11-25 NOTE — Progress Notes (Signed)
[facility]      Reason for Consult: dysuria/penile pain    Referring Physician: Dr. Patsi Searsannenbaum    Patient ID: Theodore Gutierrez, male    DOB: 06/11/1956, 60 y.o.   MRN: [id number]  HPI:   Here for evaluation of penile pain that has been ongoing for 14 months.  He equates the pain with a sexual partner he had in September 2016 and who later called him to say she is positive for herpes.  He also states after that he had an erythematous rash that went away.  Since that time, he has had severe sensitivitity of the tip of his penis.  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  No culture done since he really has not had any lesions.  His symptoms persisted and he saw a urologist with [facility], Dr Wilson SingerHudson, who did at CT and cystoscopy and no abnormalities identified.  He also had previously seen another urologist and then saw Dr. Patsi Searsannenbaum in October for another opinion.  No other abnormalities noted and he was treated with fluconazole, azithromycin and metronidazole empirically but continues to have the same discomfort.  He has continued on suppressive valacyclovir and now on acyclovir but no change in symptoms.  He states they are severe about 2-3 times a week but with constant discomfort daily.  Discomfort also includes dysuria.  He does ask if symptoms could be psychosomatic or exacerbated by depression.   Previous record reviewed from [facility] and procedures done.    Past Medical History:  Diagnosis Date  . Hypertension   . Seizures (HCC)     Prior to Admission medications   Medication Sig Start Date End Date Taking? Authorizing Provider  acyclovir (ZOVIRAX) 400 MG tablet Take 400 mg by mouth 5 (five) times daily.   Yes Historical Provider, MD  Brexpiprazole (REXULTI) 1 MG TABS Take 1 tablet by mouth daily.   Yes Historical Provider, MD  Calcium Carbonate-Vitamin D (CALCIUM 500 + D  PO) Take 1 tablet by mouth daily.   Yes Historical Provider, MD  clonazePAM (KLONOPIN) 0.5 MG tablet take 1 tablet by mouth every 6 hours if needed for SEVERE anxiety   (MUST LAST AT LEAST 30 DAYS) 01/25/16  Yes Historical Provider, MD  fenofibrate 160 MG tablet Take 160 mg by mouth daily.  02/15/16  Yes Historical Provider, MD  irbesartan (AVAPRO) 75 MG tablet Take 75 mg by mouth daily.  01/02/16  Yes Historical Provider, MD  Multiple Vitamins-Minerals (ONE-A-DAY MENS 50+ ADVANTAGE PO) Take 1 tablet by mouth daily.   Yes Historical Provider, MD  PARoxetine (PAXIL) 40 MG tablet Take 40 mg by mouth every morning.   Yes Historical Provider, MD  phenytoin (DILANTIN) 100 MG ER capsule Take 2-3 capsules (200-300 mg total) by mouth 2 (two) times daily. 300 mg in the am and 200 mg mid day 02/22/16  Yes Jessica U Vann, DO  pioglitazone (ACTOS) 30 MG tablet Take 15 mg by mouth daily.    Yes Historical Provider, MD  pravastatin (PRAVACHOL) 80 MG tablet Take 80 mg by mouth at bedtime.   Yes Historical Provider, MD  gabapentin (NEURONTIN) 300 MG capsule take 1 capsule by mouth three times a day 02/13/16   Historical Provider, MD    No Known Allergies  Social History  Substance Use Topics  . Smoking status: Never Smoker  .  Smokeless tobacco: Not on file  . Alcohol use No    FMH: no renal disease  Review of Systems  Constitutional: negative for fatigue and malaise Gastrointestinal: negative for nausea and diarrhea Musculoskeletal: negative for myalgias and arthralgias All other systems reviewed and are negative    Constitutional: in no apparent distress and alert  Vitals:   11/25/16 1404  BP: 137/83  Pulse: 73  Temp: 97.6 F (36.4 C)   EYES: anicteric ENMT: no thrush Cardiovascular: Cor RRR Respiratory: CTA B; normal respiratory effort GI: Bowel sounds are normal, liver is not enlarged, spleen is not enlarged Musculoskeletal: no pedal edema noted Skin: negatives: no rash Hematologic: no  cervical lad  Labs: Lab Results  Component Value Date   WBC 8.0 02/21/2016   HGB 13.1 02/21/2016   HCT 37.6 (L) 02/21/2016   MCV 97.7 02/21/2016   PLT 170 02/21/2016    Lab Results  Component Value Date   CREATININE 0.65 02/21/2016   BUN 13 02/21/2016   NA 134 (L) 02/21/2016   K 3.4 (L) 02/21/2016   CL 98 (L) 02/21/2016   CO2 27 02/21/2016    Lab Results  Component Value Date   ALT 22 02/21/2016   AST 30 02/21/2016   ALKPHOS 49 02/21/2016   BILITOT 0.8 02/21/2016     Assessment: unknown penile pain, dysuria.  There are case reports of similar symptoms with irbesartan.  Also could consider syphilis related but I would not expect long-term symptoms.  I do not think it is otherwise infectious in origin and certainly not HSV with no evidence of current infection, does not come and go and not affected by antivirals.  Therefore I discussed with him that I do not feel infection is the cause.  I did discuss holding his irbesartan for 2 weeks to see if there is any improvement.     Plan: 1) hold irbesartan 2) recheck your blood pressure next week 3) rtc 2 weeks  He understands that if not improved, that there likely is no treatment or other diagnostic test that can be done.  He does admit his depression and anxiety/OCD may be partially or exclusively related.

## 2016-11-25 NOTE — Assessment & Plan Note (Signed)
Holding his bp med.  He will recheck it and I will notify Dr. Duanne Guessewey

## 2016-11-26 LAB — RPR

## 2016-11-26 LAB — HIV ANTIBODY (ROUTINE TESTING W REFLEX): HIV: NONREACTIVE

## 2016-12-09 ENCOUNTER — Encounter: Payer: Self-pay | Admitting: Internal Medicine

## 2016-12-09 ENCOUNTER — Ambulatory Visit (INDEPENDENT_AMBULATORY_CARE_PROVIDER_SITE_OTHER): Payer: BLUE CROSS/BLUE SHIELD | Admitting: Internal Medicine

## 2016-12-09 DIAGNOSIS — F411 Generalized anxiety disorder: Secondary | ICD-10-CM | POA: Diagnosis not present

## 2016-12-09 DIAGNOSIS — N4889 Other specified disorders of penis: Principal | ICD-10-CM

## 2016-12-09 DIAGNOSIS — N489 Disorder of penis, unspecified: Secondary | ICD-10-CM

## 2016-12-09 DIAGNOSIS — G8929 Other chronic pain: Secondary | ICD-10-CM

## 2016-12-09 NOTE — Assessment & Plan Note (Signed)
This certainly has exacerbated his pain and may be a large part of it.  He is going to seek counseling and understands this.

## 2016-12-09 NOTE — Assessment & Plan Note (Signed)
No medical etiology identified.  I do not have other ideas for diagnosis.  If viral in origin, it will resolve over time.  I do not think continue viral suppression indicated and has not helped.   He will return prn

## 2016-12-09 NOTE — Progress Notes (Signed)
   Subjective:    Patient ID: Theodore Gutierrez, male    DOB: 11/23/1956, 60 y.o.   MRN: [id number]  HPI Here for follow up.  I saw him 2 weeks ago for penile burning pain for over 1 year.  Has previously been worked up including for STIs, urology evaluation and only thing positive was HSV1 serology.  No lesions ever noted.  Has sensitivity over tip of penis that started after a sexual exposure with HSV. Has been on acyclovir and valacyclovir.  Also Lyrica, gabapentin, Celexa and no relief.  Has been treated for STIs with no relief.  Cystoscopy was normal.  I told him to stop his irbesartan as there is an association with penile pain with it and he did for 3 days and did not note any relief.  Symptoms still 4 times a week or more.    Review of Systems  Constitutional: Negative for fever.  Gastrointestinal: Negative for diarrhea.  Genitourinary: Positive for penile pain. Negative for discharge, dysuria and hematuria.  Skin: Negative for rash.  Neurological: Negative for headaches.       Objective:   Physical Exam  Constitutional: He appears well-developed and well-nourished. No distress.  Eyes: No scleral icterus.  Musculoskeletal: He exhibits no edema.  Neurological: He is alert.  Skin: No rash noted.  Psychiatric: He has a normal mood and affect. His behavior is normal. Thought content normal.          Assessment & Plan:

## 2016-12-13 ENCOUNTER — Telehealth: Payer: Self-pay | Admitting: *Deleted

## 2016-12-13 NOTE — Telephone Encounter (Signed)
Office notes faxed to referring provider as requested. Andree CossHowell, Miaa Latterell M, RN

## 2017-02-03 IMAGING — CT CT HEAD W/O CM
2 series · 16 of 30 positions shown, 19 images · non-contrast
Comparison: CT of the head performed 06/07/2014

CLINICAL DATA: Status post recent seizure. Patient became
unresponsive. Initial encounter.

EXAM:
CT HEAD WITHOUT CONTRAST
TECHNIQUE: Contiguous axial images were obtained from the base of the skull
through the vertex without intravenous contrast.

[Series 2: head w/o · axial · non-contrast · 0.45mm/px · z∈[-143,-18]mm · 9 of 33 slices shown, 12 images]
[im 4/33  brain]
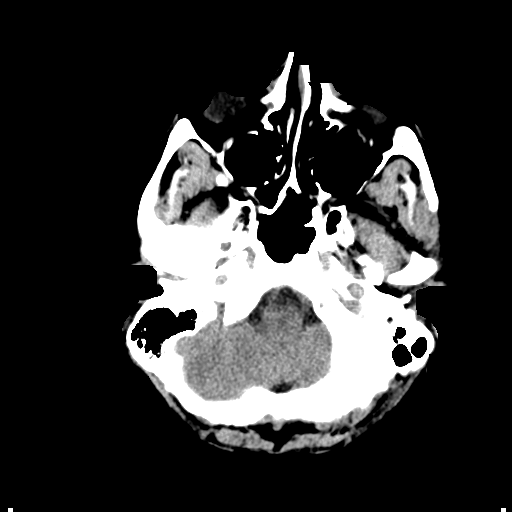
[im 4/33  bone]
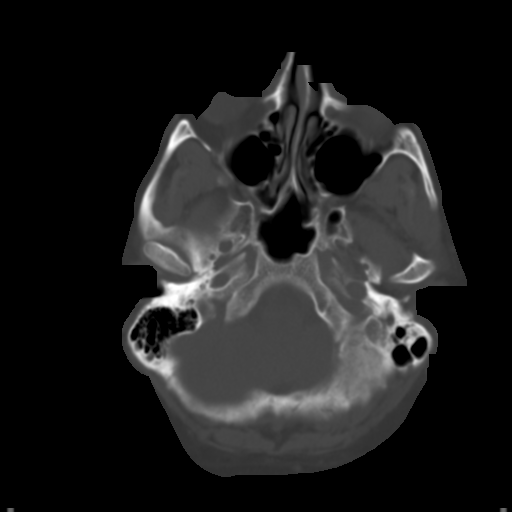
[im 7/33  brain]
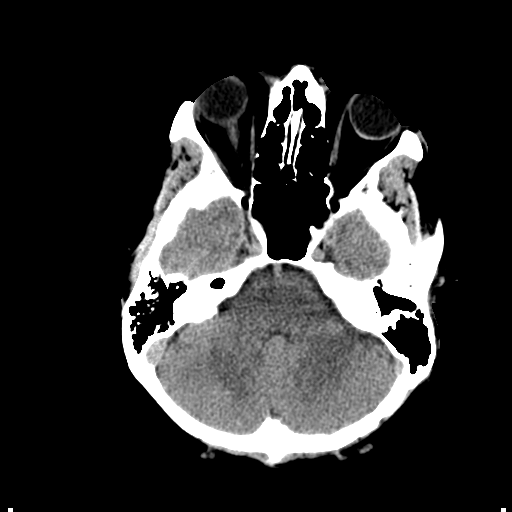
[im 10/33  brain]
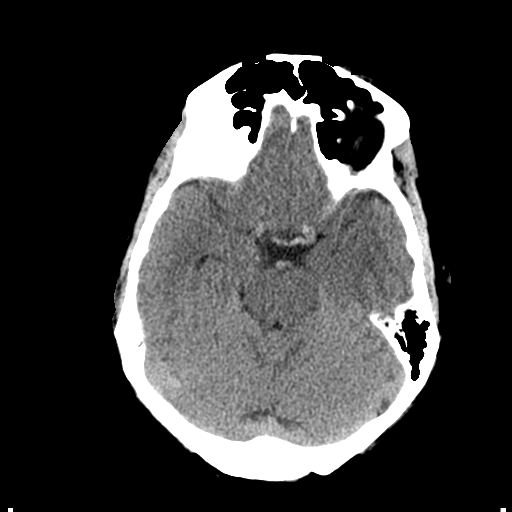
[im 13/33  brain]
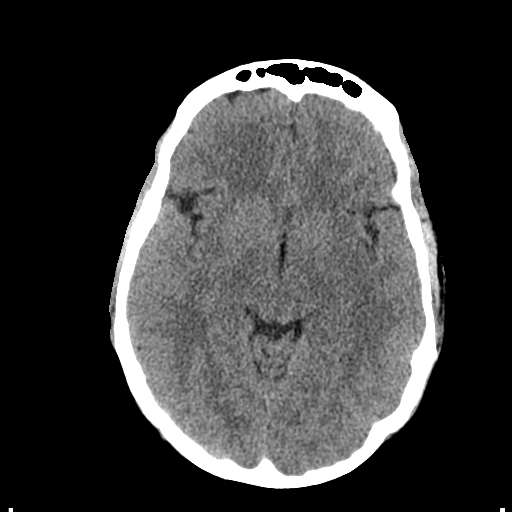
[im 17/33  brain]
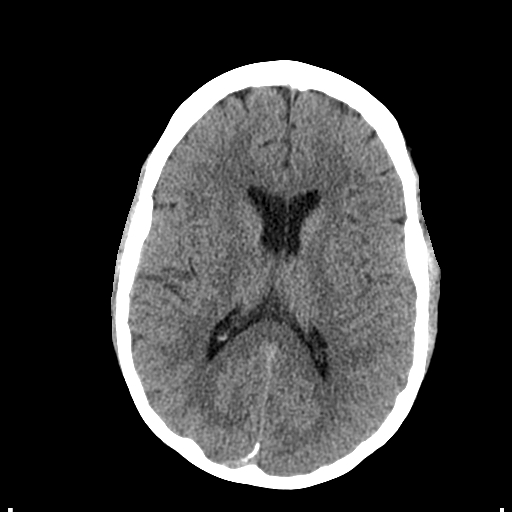
[im 17/33  bone]
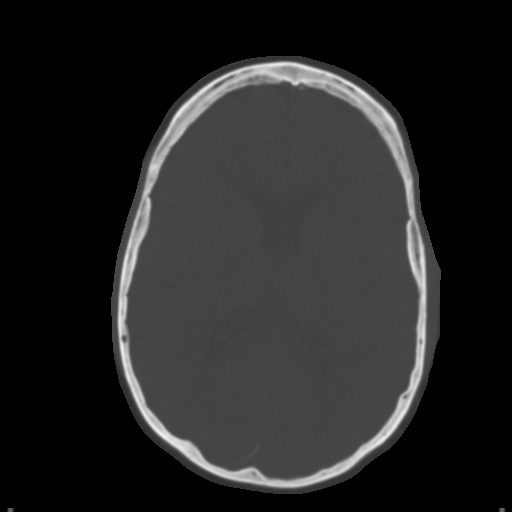
[im 20/33  brain]
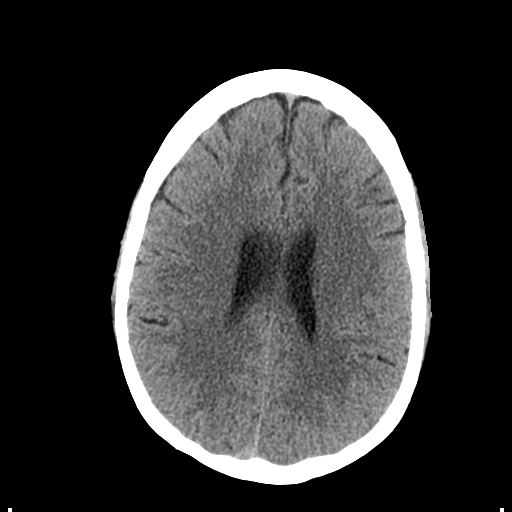
[im 23/33  brain]
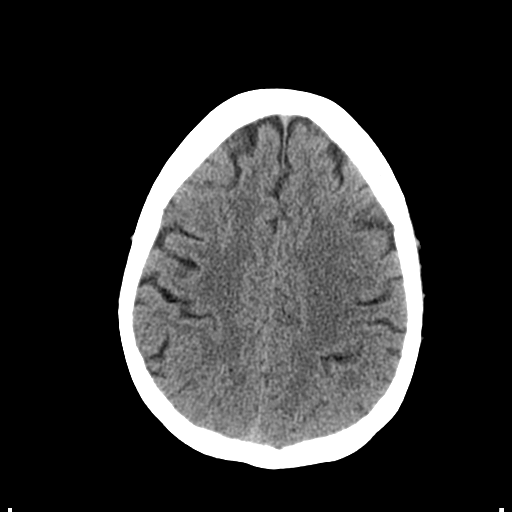
[im 26/33  brain]
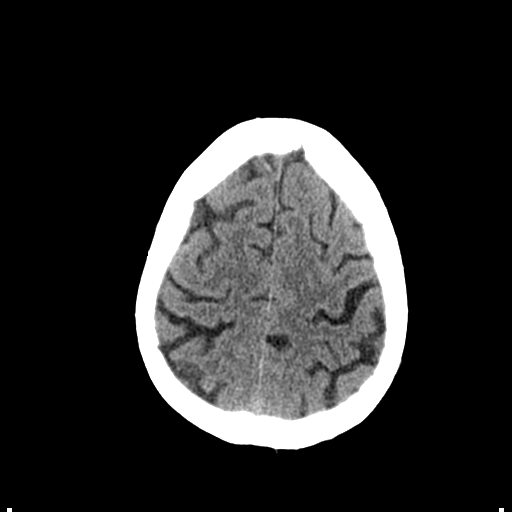
[im 29/33  brain]
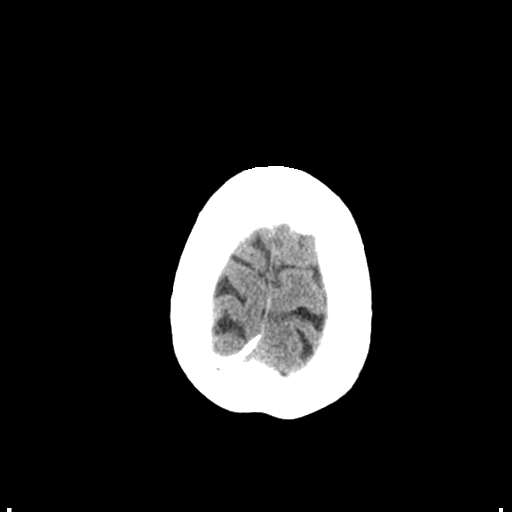
[im 29/33  bone]
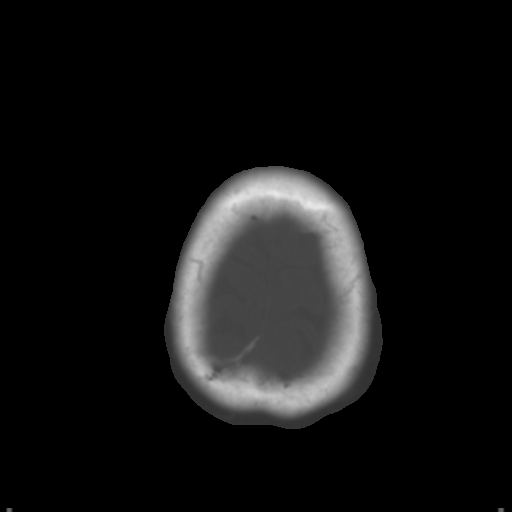

[Series 3: bone windows · axial · 0.45mm/px · z∈[-140,-32]mm · 7 of 55 slices shown]
[im 7/55  bone]
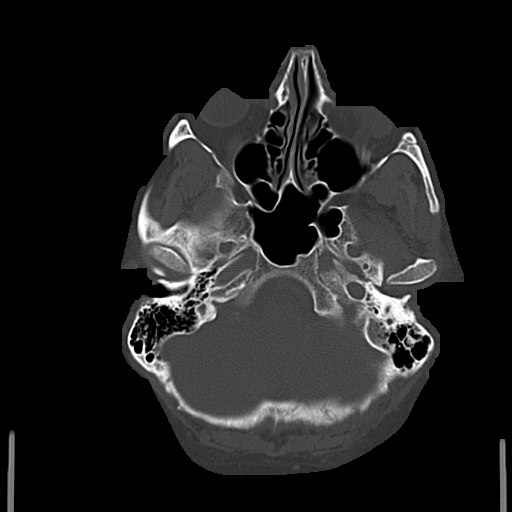
[im 13/55  bone]
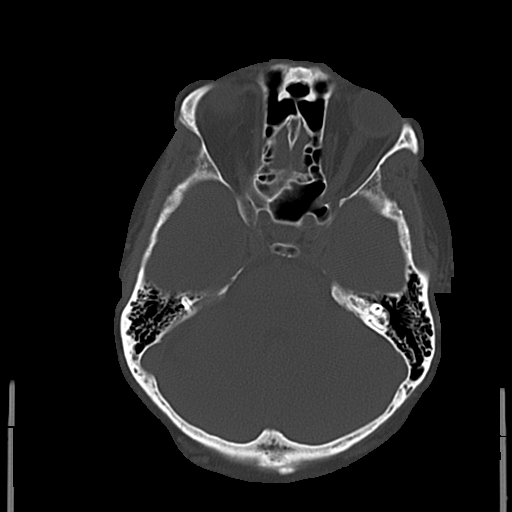
[im 19/55  bone]
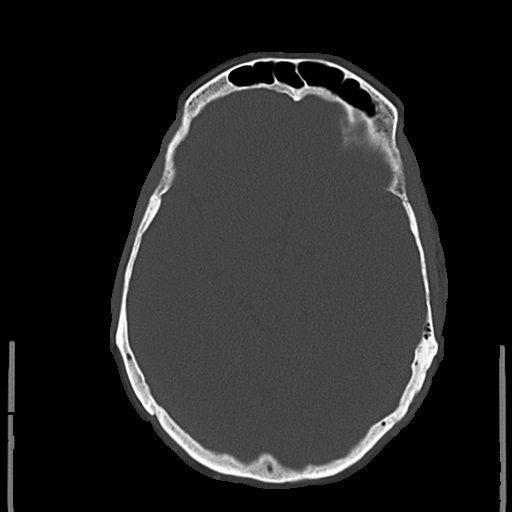
[im 25/55  bone]
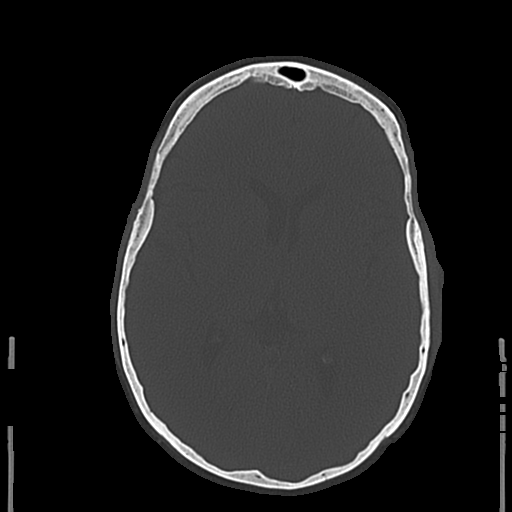
[im 31/55  bone]
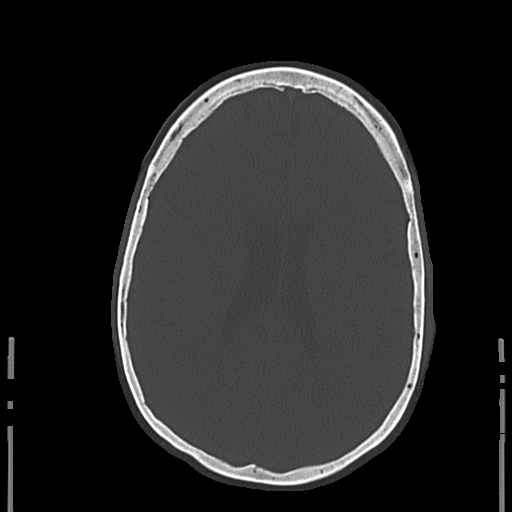
[im 37/55  bone]
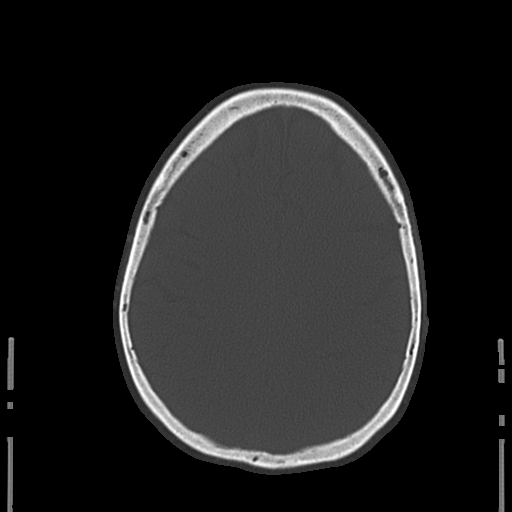
[im 43/55  bone]
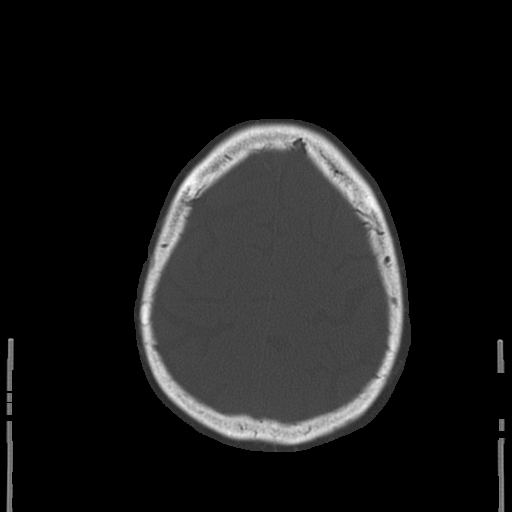

[16 of 30 positions shown; findings below may reference images not displayed]

FINDINGS: There is no evidence of acute infarction, mass lesion, or intra- or
extra-axial hemorrhage on CT.

Mild periventricular white matter change may reflect small vessel
ischemic microangiopathy.

The posterior fossa, including the cerebellum, brainstem and fourth
ventricle, is within normal limits. The third and lateral
ventricles, and basal ganglia are unremarkable in appearance. The
cerebral hemispheres are symmetric in appearance, with normal
gray-white differentiation. No mass effect or midline shift is seen.

There is no evidence of fracture; visualized osseous structures are
unremarkable in appearance. The orbits are within normal limits. The
paranasal sinuses and mastoid air cells are well-aerated. No
significant soft tissue abnormalities are seen.
IMPRESSION: 1. No acute intracranial pathology seen on CT.
2. Mild small vessel ischemic microangiopathy.

## 2017-12-30 ENCOUNTER — Encounter: Payer: Self-pay | Admitting: Gastroenterology

## 2018-02-06 ENCOUNTER — Ambulatory Visit (AMBULATORY_SURGERY_CENTER): Payer: Self-pay | Admitting: *Deleted

## 2018-02-06 ENCOUNTER — Other Ambulatory Visit: Payer: Self-pay

## 2018-02-06 VITALS — Ht 74.0 in | Wt 181.6 lb

## 2018-02-06 DIAGNOSIS — Z1211 Encounter for screening for malignant neoplasm of colon: Secondary | ICD-10-CM

## 2018-02-06 MED ORDER — PEG 3350-KCL-NA BICARB-NACL 420 G PO SOLR: 4000.0000 mL | Freq: Once | ORAL | 0 refills | Status: AC

## 2018-02-06 NOTE — Progress Notes (Signed)
No egg or soy allergy known to patient  No issues with past sedation with any surgeries  or procedures, no intubation problems  No diet pills per patient No home 02 use per patient  No blood thinners per patient  Pt denies issues with constipation pt saw PCP and Dr. Ronda FairlyAdvise him to take 2 Colace twice daily x 2-3 having BM once every 2-3 days No A fib or A flutter  EMMI video sent to pt's e mail pt. declined

## 2018-02-07 ENCOUNTER — Encounter: Payer: Self-pay | Admitting: Gastroenterology

## 2018-02-20 ENCOUNTER — Ambulatory Visit (AMBULATORY_SURGERY_CENTER): Payer: BLUE CROSS/BLUE SHIELD | Admitting: Gastroenterology

## 2018-02-20 ENCOUNTER — Other Ambulatory Visit: Payer: Self-pay

## 2018-02-20 ENCOUNTER — Encounter: Payer: Self-pay | Admitting: Gastroenterology

## 2018-02-20 VITALS — BP 120/85 | HR 79 | Temp 95.9°F | Resp 11 | Ht 74.0 in | Wt 181.0 lb

## 2018-02-20 DIAGNOSIS — Z1212 Encounter for screening for malignant neoplasm of rectum: Secondary | ICD-10-CM | POA: Diagnosis not present

## 2018-02-20 DIAGNOSIS — K649 Unspecified hemorrhoids: Secondary | ICD-10-CM

## 2018-02-20 DIAGNOSIS — Z1211 Encounter for screening for malignant neoplasm of colon: Secondary | ICD-10-CM | POA: Diagnosis present

## 2018-02-20 MED ORDER — SODIUM CHLORIDE 0.9 % IV SOLN: 500.0000 mL | Freq: Once | INTRAVENOUS | Status: DC

## 2018-02-20 NOTE — Progress Notes (Signed)
A and O x3. Report to RN. Tolerated MAC anesthesia well.

## 2018-02-20 NOTE — Progress Notes (Signed)
No problems noted in the recovery room. maw 

## 2018-02-20 NOTE — Op Note (Signed)
[facility] Patient Name: Theodore KochJonathan Casebier Procedure Date: 02/20/2018 9:07 AM MRN: [id number] Endoscopist: Rachael Feeaniel P Jacobs , MD Age: [age] Referring MD:  Date of Birth: 07/27/1956 Gender: Male Account #: [id number] Procedure:                Colonoscopy Indications:              Screening for colorectal malignant neoplasm Medicines:                Monitored Anesthesia Care Procedure:                Pre-Anesthesia Assessment:                           - Prior to the procedure, a History and Physical                            was performed, and patient medications and                            allergies were reviewed. The patient's tolerance of                            previous anesthesia was also reviewed. The risks                            and benefits of the procedure and the sedation                            options and risks were discussed with the patient.                            All questions were answered, and informed consent                            was obtained. Prior Anticoagulants: The patient has                            taken no previous anticoagulant or antiplatelet                            agents. ASA Grade Assessment: II - A patient with                            mild systemic disease. After reviewing the risks                            and benefits, the patient was deemed in                            satisfactory condition to undergo the procedure.                           After obtaining informed consent, the colonoscope  was passed under direct vision. Throughout the                            procedure, the patient's blood pressure, pulse, and                            oxygen saturations were monitored continuously. The                            Colonoscope was introduced through the anus and                            advanced to the the cecum, identified by                            appendiceal orifice and  ileocecal valve. The                            colonoscopy was performed without difficulty. The                            patient tolerated the procedure well. The quality                            of the bowel preparation was good. The ileocecal                            valve, appendiceal orifice, and rectum were                            photographed. Scope In: 9:12:53 AM Scope Out: 9:25:23 AM Scope Withdrawal Time: 0 hours 10 minutes 4 seconds  Total Procedure Duration: 0 hours 12 minutes 30 seconds  Findings:                 Internal hemorrhoids were found. The hemorrhoids                            were small.                           The exam was otherwise without abnormality on                            direct and retroflexion views. Complications:            No immediate complications. Estimated blood loss:                            None. Estimated Blood Loss:     Estimated blood loss: none. Impression:               - Internal hemorrhoids.                           - The examination was otherwise normal on direct  and retroflexion views.                           - No polyps or cancers. Recommendation:           - Patient has a contact number available for                            emergencies. The signs and symptoms of potential                            delayed complications were discussed with the                            patient. Return to normal activities tomorrow.                            Written discharge instructions were provided to the                            patient.                           - Resume previous diet.                           - Continue present medications.                           - Repeat colonoscopy in 10 years for screening. Rachael Fee, MD 02/20/2018 9:26:55 AM This report has been signed electronically.

## 2018-02-20 NOTE — Patient Instructions (Addendum)
YOU HAD AN ENDOSCOPIC PROCEDURE TODAY AT THE [facility]:   Refer to the procedure report that was given to you for any specific questions about what was found during the examination.  If the procedure report does not answer your questions, please call your gastroenterologist to clarify.  If you requested that your care partner not be given the details of your procedure findings, then the procedure report has been included in a sealed envelope for you to review at your convenience later.  YOU SHOULD EXPECT: Some feelings of bloating in the abdomen. Passage of more gas than usual.  Walking can help get rid of the air that was put into your GI tract during the procedure and reduce the bloating. If you had a lower endoscopy (such as a colonoscopy or flexible sigmoidoscopy) you may notice spotting of blood in your stool or on the toilet paper. If you underwent a bowel prep for your procedure, you may not have a normal bowel movement for a few days.  Please Note:  You might notice some irritation and congestion in your nose or some drainage.  This is from the oxygen used during your procedure.  There is no need for concern and it should clear up in a day or so.  SYMPTOMS TO REPORT IMMEDIATELY:   Following lower endoscopy (colonoscopy or flexible sigmoidoscopy):  Excessive amounts of blood in the stool  Significant tenderness or worsening of abdominal pains  Swelling of the abdomen that is new, acute  Fever of 100F or higher   For urgent or emergent issues, a gastroenterologist can be reached at any hour by calling [phone].   DIET:  We do recommend a small meal at first, but then you may proceed to your regular diet.  Drink plenty of fluids but you should avoid alcoholic beverages for 24 hours.  ACTIVITY:  You should plan to take it easy for the rest of today and you should NOT DRIVE or use heavy machinery until tomorrow (because of the sedation medicines used during the test).     FOLLOW UP: Our staff will call the number listed on your records the next business day following your procedure to check on you and address any questions or concerns that you may have regarding the information given to you following your procedure. If we do not reach you, we will leave a message.  However, if you are feeling well and you are not experiencing any problems, there is no need to return our call.  We will assume that you have returned to your regular daily activities without incident.  If any biopsies were taken you will be contacted by phone or by letter within the next 1-3 weeks.  Please call us at [phone] if you have not heard about the biopsies in 3 weeks.    SIGNATURES/CONFIDENTIALITY: You and/or your care partner have signed paperwork which will be entered into your electronic medical record.  These signatures attest to the fact that that the information above on your After Visit Summary has been reviewed and is understood.  Full responsibility of the confidentiality of this discharge information lies with you and/or your care-partner.    Handout was given to your care partner on hemorrhoids. You may resume your current medications today. Next screening colonoscopy in 10 years. Please call if any questions or concerns.   

## 2018-02-21 ENCOUNTER — Telehealth: Payer: Self-pay

## 2018-02-21 NOTE — Telephone Encounter (Signed)
  Follow up Call-  Call back number 02/20/2018  Post procedure Call Back phone  # [phone]  Permission to leave phone message Yes  Some recent data might be hidden     Patient questions:  Do you have a fever, pain , or abdominal swelling? No. Pain Score  0 *  Have you tolerated food without any problems? Yes.    Have you been able to return to your normal activities? Yes.    Do you have any questions about your discharge instructions: Diet   No. Medications  No. Follow up visit  No.  Do you have questions or concerns about your Care? No.  Actions: * If pain score is 4 or above: No action needed, pain <4.

## 2023-11-27 ENCOUNTER — Emergency Department (HOSPITAL_COMMUNITY)
Admission: EM | Admit: 2023-11-27 | Discharge: 2023-11-28 | Disposition: A | Discharge status: Home / Self Care | Payer: Medicare PPO | Attending: Emergency Medicine | Admitting: Emergency Medicine

## 2023-11-27 ENCOUNTER — Other Ambulatory Visit: Payer: Self-pay

## 2023-11-27 DIAGNOSIS — S82852A Displaced trimalleolar fracture of left lower leg, initial encounter for closed fracture: Secondary | ICD-10-CM | POA: Diagnosis not present

## 2023-11-27 DIAGNOSIS — Z7984 Long term (current) use of oral hypoglycemic drugs: Secondary | ICD-10-CM | POA: Insufficient documentation

## 2023-11-27 DIAGNOSIS — S99912A Unspecified injury of left ankle, initial encounter: Secondary | ICD-10-CM | POA: Diagnosis present

## 2023-11-27 DIAGNOSIS — I10 Essential (primary) hypertension: Secondary | ICD-10-CM | POA: Diagnosis not present

## 2023-11-27 DIAGNOSIS — W108XXA Fall (on) (from) other stairs and steps, initial encounter: Secondary | ICD-10-CM | POA: Diagnosis not present

## 2023-11-27 DIAGNOSIS — Z79899 Other long term (current) drug therapy: Secondary | ICD-10-CM | POA: Insufficient documentation

## 2023-11-27 DIAGNOSIS — E119 Type 2 diabetes mellitus without complications: Secondary | ICD-10-CM | POA: Diagnosis not present

## 2023-11-27 DIAGNOSIS — M7989 Other specified soft tissue disorders: Secondary | ICD-10-CM | POA: Insufficient documentation

## 2023-11-27 DIAGNOSIS — Z7982 Long term (current) use of aspirin: Secondary | ICD-10-CM | POA: Insufficient documentation

## 2023-11-27 NOTE — ED Notes (Signed)
Provider at bedside speaking with patient

## 2023-11-28 ENCOUNTER — Emergency Department (HOSPITAL_COMMUNITY): Payer: Medicare PPO

## 2023-11-28 MED ORDER — OXYCODONE-ACETAMINOPHEN 5-325 MG PO TABS: 1.0000 | ORAL_TABLET | Freq: Four times a day (QID) | ORAL | 0 refills | Status: DC | PRN

## 2023-11-28 MED ORDER — PROPOFOL 10 MG/ML IV BOLUS
1.0000 mg/kg | Freq: Once | INTRAVENOUS | Status: AC
  Administered 2023-11-28: 50 mg via INTRAVENOUS
  Filled 2023-11-28: qty 20

## 2023-11-28 MED ORDER — ONDANSETRON HCL 4 MG/2ML IJ SOLN
4.0000 mg | Freq: Once | INTRAMUSCULAR | Status: AC
Start: 2023-11-28 — End: 2023-11-28
  Administered 2023-11-28: 4 mg via INTRAVENOUS
  Filled 2023-11-28: qty 2

## 2023-11-28 MED ORDER — MORPHINE SULFATE (PF) 4 MG/ML IV SOLN
4.0000 mg | Freq: Once | INTRAVENOUS | Status: AC
  Administered 2023-11-28: 4 mg via INTRAVENOUS
  Filled 2023-11-28: qty 1

## 2023-11-28 NOTE — ED Notes (Signed)
Ortho tech called, will be here soon.

## 2023-11-28 NOTE — ED Notes (Signed)
Pain medication requested from provider.

## 2023-11-28 NOTE — ED Provider Notes (Signed)
[place] [facility] AT [facility] Provider Note   CSN: [id number] Arrival date & time: 11/27/23  2340     History  Chief Complaint  Patient presents with   Fall   Ankle Pain    Patient brought in by medics for a fall at home. Patient stated he was sleep and got up to go to the kitchen and he thinks that his ankle was asleep and he moved too fast when he got  up. Patient stated he got up too fast and should have taken his time. AxO x4     Theodore Gutierrez is a 67 y.o. male.  HPI     This is a 67 year old male who presents with left ankle pain.  Patient reports that he got up quickly after falling asleep on the bed.  He states that he felt like his left foot had gone to sleep.  He describes a pins and needle sensation in the left foot.  Because of that he missed stepped and fell.  He is reporting left ankle pain.  Reports that sensation in the left foot is now normal with the exception of pain.  Denies hitting his head or loss of consciousness.  He is not on any blood thinners.  Denies any weakness, numbness, tingling otherwise in his extremities.  Home Medications Prior to Admission medications   Medication Sig Start Date End Date Taking? Authorizing Provider  oxyCODONE-acetaminophen (PERCOCET/ROXICET) 5-325 MG tablet Take 1 tablet by mouth every 6 (six) hours as needed for severe pain (pain score 7-10). 11/28/23  Yes Alora Gorey, Mayer Masker, MD  aspirin EC 81 MG tablet Take 81 mg by mouth daily.    [provider]  Calcium Carbonate-Vitamin D (CALCIUM 500 + D PO) Take 1 tablet by mouth daily.    [provider]  clomiPRAMINE (ANAFRANIL) 75 MG capsule Take 150 mg by mouth at bedtime.    [provider]  clonazePAM (KLONOPIN) 0.5 MG tablet take 1 tablet by mouth every 6 hours if needed for SEVERE anxiety   (MUST LAST AT LEAST 30 DAYS) 01/25/16   [provider]  docusate sodium (COLACE) 100 MG capsule Take 100 mg by mouth 2 (two)  times daily.    [provider]  fenofibrate 160 MG tablet Take 160 mg by mouth daily.  02/15/16   [provider]  hydrochlorothiazide (HYDRODIURIL) 12.5 MG tablet  09/26/16   [provider]  irbesartan (AVAPRO) 75 MG tablet Take 75 mg by mouth daily.  01/02/16   [provider]  lamoTRIgine (LAMICTAL) 100 MG tablet Take by mouth.    [provider]  Multiple Vitamins-Minerals (ONE-A-DAY MENS 50+ ADVANTAGE PO) Take 1 tablet by mouth daily.    [provider]  phenytoin (DILANTIN) 100 MG ER capsule Take 2-3 capsules (200-300 mg total) by mouth 2 (two) times daily. 300 mg in the am and 200 mg mid day 02/22/16   Joseph Art, DO  rosuvastatin (CRESTOR) 40 MG tablet Take 40 mg by mouth daily.    [provider]      Allergies    Patient has no known allergies.    Review of Systems   Review of Systems  Constitutional:  Negative for fever.  Respiratory:  Negative for shortness of breath.   Musculoskeletal:        Ankle pain  Neurological:  Positive for numbness.  All other systems reviewed and are negative.   Physical Exam Updated Vital Signs  BP 119/82   Pulse (!) 58   Temp 97.6 F (36.4 C) (Oral)   Resp 15   Ht 1.753 m (5\' 9" )   Wt 84 kg   SpO2 97%   BMI 27.35 kg/m  Physical Exam Vitals and nursing note reviewed.  Constitutional:      Appearance: He is well-developed. He is not ill-appearing.  HENT:     Head: Normocephalic and atraumatic.  Eyes:     Pupils: Pupils are equal, round, and reactive to light.  Cardiovascular:     Rate and Rhythm: Normal rate and regular rhythm.     Heart sounds: Normal heart sounds. No murmur heard. Pulmonary:     Effort: Pulmonary effort is normal. No respiratory distress.     Breath sounds: Normal breath sounds. No wheezing.  Abdominal:     Palpations: Abdomen is soft.     Tenderness: There is no abdominal tenderness.  Musculoskeletal:     Cervical back: Neck supple.      Comments: Focused examination of the left ankle with eversion of the foot with medial tenting at the medial malleolus, 2+ DP pulse, perfusion intact distally  Lymphadenopathy:     Cervical: No cervical adenopathy.  Skin:    General: Skin is warm and dry.  Neurological:     Mental Status: He is alert and oriented to person, place, and time.     ED Results / Procedures / Treatments   Labs (all labs ordered are listed, but only abnormal results are displayed) Labs Reviewed - No data to display  EKG None  Radiology DG Ankle Complete Left  Result Date: 11/28/2023 CLINICAL DATA:  Status post fall, post reduction images. EXAM: LEFT ANKLE COMPLETE - 3+ VIEW COMPARISON:  November 28, 2023 (12:21 a.m.) FINDINGS: The left ankle was imaged in a fiberglass cast with subsequently obscured osseous and soft tissue detail. Acute tri malleolar fracture of the left ankle is again seen with gross anatomic alignment of the fracture sites. Interval reduction of the previously noted left ankle dislocation is noted. There is no evidence of arthropathy or other focal bone abnormality. Mild diffuse soft tissue swelling is seen. IMPRESSION: Acute tri malleolar fracture of the left ankle with gross anatomic alignment of the fracture sites. Electronically Signed   By: Aram Candela M.D.   On: 11/28/2023 02:18   DG Ankle Complete Left  Result Date: 11/28/2023 CLINICAL DATA:  Patient rolled the left ankle during a fall. EXAM: LEFT ANKLE COMPLETE - 3+ VIEW COMPARISON:  None Available. FINDINGS: Transverse comminuted fracture of the medial malleolus with about 1.6 cm lateral displacement of the distal fracture fragment as well as the talus with respect to the tibia. Oblique fracture of the distal fibula with full shaft width posterolateral displacement and posterior overriding of the distal fracture fragment. Probable displaced coronal fracture of the posterior malleolus. Posterior displacement of the talus with respect  to the tibia. IMPRESSION: Comminuted and displaced trimalleolar fractures of the left ankle. Electronically Signed   By: Burman Nieves M.D.   On: 11/28/2023 01:06    Procedures .Reduction of fracture  Date/Time: 11/28/2023 1:32 AM  Performed by: Shon Baton, MD Authorized by: Shon Baton, MD  Consent: Written consent obtained. Consent given by: patient Patient understanding: patient states understanding of the procedure being performed Time out: Immediately prior to procedure a "time out" was called to verify the correct patient, procedure, equipment, support staff and site/side marked as required. Local anesthesia used: no  Anesthesia:  Local anesthesia used: no  Sedation: Patient sedated: yes Sedation type: moderate (conscious) sedation Sedatives: propofol Analgesia: morphine Sedation start date/time: 11/28/2023 1:15 AM Sedation end date/time: 11/28/2023 1:32 AM Vitals: Vital signs were monitored during sedation.  Patient tolerance: patient tolerated the procedure well with no immediate complications Comments: Closed reduction of left trimalleolar fracture   .Sedation  Date/Time: 11/28/2023 1:33 AM  Performed by: Shon Baton, MD Authorized by: Shon Baton, MD   Consent:    Consent obtained:  Written   Consent given by:  Patient   Risks discussed:  Inadequate sedation, dysrhythmia, allergic reaction, prolonged hypoxia resulting in organ damage, prolonged sedation necessitating reversal, respiratory compromise necessitating ventilatory assistance and intubation, vomiting and nausea Universal protocol:    Immediately prior to procedure, a time out was called: yes   Indications:    Procedure performed:  Fracture reduction   Procedure necessitating sedation performed by:  Physician performing sedation Pre-sedation assessment:    Time since last food or drink:  1700   NPO status caution: urgency dictates proceeding with non-ideal NPO status      ASA classification: class 2 - patient with mild systemic disease     Mouth opening:  3 or more finger widths   Thyromental distance:  4 finger widths   Mallampati score:  I - soft palate, uvula, fauces, pillars visible   Neck mobility: normal     Pre-sedation assessments completed and reviewed: airway patency, cardiovascular function, hydration status, mental status, nausea/vomiting, pain level, respiratory function and temperature   A pre-sedation assessment was completed prior to the start of the procedure Immediate pre-procedure details:    Reassessment: Patient reassessed immediately prior to procedure     Reviewed: vital signs and NPO status     Verified: bag valve mask available, emergency equipment available, intubation equipment available, IV patency confirmed and oxygen available   Procedure details (see MAR for exact dosages):    Preoxygenation:  Nasal cannula   Sedation:  Propofol   Intended level of sedation: deep   Analgesia:  Morphine   Intra-procedure monitoring:  Blood pressure monitoring, continuous capnometry, frequent LOC assessments and frequent vital sign checks   Intra-procedure events: hypoxia     Intra-procedure events comment:  O2 sats dropped to 89%   Intra-procedure management:  Airway repositioning   Total Provider sedation time (minutes):  15 Post-procedure details:   A post-sedation assessment was completed following the completion of the procedure.   Attendance: Constant attendance by certified staff until patient recovered     Recovery: Patient returned to pre-procedure baseline     Post-sedation assessments completed and reviewed: airway patency, cardiovascular function, hydration status, mental status, nausea/vomiting, pain level, respiratory function and temperature     Patient is stable for discharge or admission: yes     Procedure completion:  Tolerated well, no immediate complications     Medications Ordered in ED Medications  propofol (DIPRIVAN)  10 mg/mL bolus/IV push 84 mg (50 mg Intravenous Given 11/28/23 0117)  morphine (PF) 4 MG/ML injection 4 mg (4 mg Intravenous Given 11/28/23 0047)  ondansetron (ZOFRAN) injection 4 mg (4 mg Intravenous Given 11/28/23 0046)    ED Course/ Medical Decision Making/ A&P Clinical Course as of 11/28/23 0233  Mon Nov 28, 2023  0209 Patient is awake alert.  Postreduction films reviewed by myself and show improved alignment.  He is neurovascularly intact.  Will consult with orthopedics for follow-up. [CH]    Clinical Course User Index [CH] Alanea Woolridge, Toni Amend  F, MD                                 Medical Decision Making Amount and/or Complexity of Data Reviewed Radiology: ordered.  Risk Prescription drug management.   This patient presents to the ED for concern of ankle pain, this involves an extensive number of treatment options, and is a complaint that carries with it a high risk of complications and morbidity.  I considered the following differential and admission for this acute, potentially life threatening condition.  The differential diagnosis includes fracture, dislocation, sprain  MDM:    This is a 67 year old male who presents with concern for left ankle injury.  He is overall nontoxic-appearing.  Fall appears mechanical.  Reports his leg fell asleep.  Sensation has returned to normal.  Low suspicion for stroke as he otherwise is neurologically intact.  He has some tenting over the medial malleolus.  Highly suspect fracture versus dislocation.  X-rays reviewed and show a displaced trimalleolar fracture.  Patient was sedated and fracture was reduced at the bedside with good alignment.  Discussed with Dr. Jena Gauss.  Patient provided follow-up information.  He was given crutches and able to ambulate with crutches without difficulty.  (Labs, imaging, consults)  Labs: I Ordered, and personally interpreted labs.  The pertinent results include: None  Imaging Studies ordered: I ordered imaging  studies including pre and postreduction ankle x-rays I independently visualized and interpreted imaging. I agree with the radiologist interpretation  Additional history obtained from brother at bedside.  External records from outside source obtained and reviewed including prior evaluations  Cardiac Monitoring: The patient was maintained on a cardiac monitor.  If on the cardiac monitor, I personally viewed and interpreted the cardiac monitored which showed an underlying rhythm of: Sinus  Reevaluation: After the interventions noted above, I reevaluated the patient and found that they have :improved  Social Determinants of Health:  lives independently  Disposition: Discharge  Co morbidities that complicate the patient evaluation  Past Medical History:  Diagnosis Date   Anxiety    Depression    Diabetes mellitus without complication (HCC)    no longer on medication   Hyperlipidemia    Hypertension    Seizures (HCC)      Medicines Meds ordered this encounter  Medications   propofol (DIPRIVAN) 10 mg/mL bolus/IV push 84 mg   morphine (PF) 4 MG/ML injection 4 mg   ondansetron (ZOFRAN) injection 4 mg   oxyCODONE-acetaminophen (PERCOCET/ROXICET) 5-325 MG tablet    Sig: Take 1 tablet by mouth every 6 (six) hours as needed for severe pain (pain score 7-10).    Dispense:  15 tablet    Refill:  0    I have reviewed the patients home medicines and have made adjustments as needed  Problem List / ED Course: Problem List Items Addressed This Visit   None Visit Diagnoses     Closed trimalleolar fracture of left ankle, initial encounter    -  Primary                   Final Clinical Impression(s) / ED Diagnoses Final diagnoses:  Closed trimalleolar fracture of left ankle, initial encounter    Rx / DC Orders ED Discharge Orders          Ordered    oxyCODONE-acetaminophen (PERCOCET/ROXICET) 5-325 MG tablet  Every 6 hours PRN        11/28/23  1610               Shon Baton, MD 11/28/23 [phone]

## 2023-11-28 NOTE — ED Notes (Signed)
Patient wheeled out to exit in wheelchair to meet brother who is transporting him home.

## 2023-11-28 NOTE — Progress Notes (Signed)
Orthopedic Tech Progress Note Patient Details:  Theodore Gutierrez February 07, 1956 [id number]  Ortho Devices Type of Ortho Device: Post (short leg) splint, Stirrup splint Ortho Device/Splint Location: lle Ortho Device/Splint Interventions: Ordered, Application, Adjustment  I applied the splint post reduction with the drs assist. Post Interventions Patient Tolerated: Well Instructions Provided: Care of device, Adjustment of device  [name] 11/28/2023, 1:59 AM

## 2023-11-28 NOTE — ED Notes (Addendum)
Remaining 15 mLs of Propofol wasted witnessed by [place], Charity fundraiser

## 2023-11-28 NOTE — ED Notes (Signed)
Patient assisted with getting dressed, and assisted with getting into wheelchair

## 2023-11-28 NOTE — ED Notes (Addendum)
0113 Procedure Time out started  Provider Willaim Rayas, Primary nurse and ortho tech Manny at bedside  Propofol 5 mls @ 0117 pushed by provider [place] in at 0118  Hr 60 R 20  96 pulse Eco2 30  0120 sats dropped to 88% 8L applied [place] 0121 sats 92 % on  8L [place] Foot splinted by ortho tech Manny and in place  Procedure ended 0126

## 2023-11-28 NOTE — ED Notes (Signed)
Patient education on crutches and demonstrated teach back. Nurse answered patient questions and concerns.

## 2023-11-28 NOTE — Discharge Instructions (Addendum)
You were seen today and found to have a trimalleolar fracture of the left ankle.  You will likely need some surgical repair.  Follow-up with Dr. Jena Gauss.  Call his office later today for follow-up on Tuesday.  Use crutches and do not bear weight.  A prescription for Percocet was sent to the pharmacy for pain control.  Keep elevated.

## 2023-11-28 NOTE — ED Notes (Signed)
Provided patient and his brother with sandwhich and ginger ale at this time

## 2023-11-28 NOTE — ED Notes (Addendum)
Ortho and respiratory (spoke with Josh) called for patient due to patient needing to be sedating and fracture to his L ankle. Respiratory will be here in 10 mins

## 2023-11-29 ENCOUNTER — Ambulatory Visit: Payer: Self-pay | Admitting: Student

## 2023-11-30 NOTE — H&P (Signed)
[facility] ([facility]) H&P  Patient ID: Theodore Gutierrez MRN: [id number] DOB/AGE: 65-Feb-1957 65 y.o.  Reason for surgery: Left ankle fracture  HPI: Theodore Gutierrez is an 67 y.o. male with past medical history significant for anxiety, depression, diabetes, hyperlipidemia, hypertension presenting for surgery on left lower extremity.  Patient sustained fall at home on 11/27/2023, landing on his left leg.  Had immediate pain in the left ankle and foot and was unable to weight-bear.  He was seen in the emergency department at [facility] and found to have a left ankle fracture.  Orthopedics consulted for evaluation and management.  Fracture successfully reduced by EDP and patient subsequently placed in a short leg splint and discharged home with instructions to follow-up with orthopedics.  He presented to the OTS clinic on 11/29/2023 for reevaluation.  He presents now for surgery.  He denies any numbness or tingling throughout the left lower extremity.  No other injuries from his fall.  Not currently on any blood thinners.  Patient does have a history of diabetes, hemoglobin A1c 5.5 when last checked (09/03/2022).  He is no longer on diabetic medications. Ambulates at baseline with no assistive device.  Past Medical History:  Diagnosis Date   Anxiety    Depression    Diabetes mellitus without complication (HCC)    no longer on medication   Hyperlipidemia    Hypertension    Seizures (HCC)     Past Surgical History:  Procedure Laterality Date   TONSILLECTOMY     67 years old    Family History  Problem Relation Age of Onset   Esophageal cancer Maternal Grandfather    Colon cancer Neg Hx    Colon polyps Neg Hx    Rectal cancer Neg Hx    Stomach cancer Neg Hx     Social History:  reports that he has never smoked. He has never used smokeless tobacco. He reports that he does not drink alcohol and does not use drugs.  Allergies: No Known Allergies  Medications: I have  reviewed the patient's current medications. Prior to Admission:  No medications prior to admission.    ROS: Constitutional: No fever or chills Vision: No changes in vision ENT: No difficulty swallowing CV: No chest pain Pulm: No SOB or wheezing GI: No nausea or vomiting GU: No urgency or inability to hold urine Skin: No poor wound healing Neurologic: No numbness or tingling Psychiatric: No depression or anxiety Heme: No bruising Allergic: No reaction to medications or food   Exam: There were no vitals taken for this visit. General: No acute distress Orientation: Alert and oriented x 4 Mood and Affect: Mood and affect appropriate, pleasant and cooperative Gait: Nonweightbearing left lower extremity Coordination and balance: Within normal limits  Left lower extremity: Well-padded, well-fitting short leg splint in place.  Nontender above splint.  Splint not fully removed but window made to evaluate skin.  Appropriate skin wrinkling.  Tolerates gentle knee range of motion.  Able to wiggle the toes.  Toes warm and well-perfused.  Right lower extremity: Skin without lesions. No tenderness to palpation. Full painless ROM, full strength in each muscle group without evidence of instability.   Medical Decision Making: Data: Imaging: AP, lateral, mortise view of the left ankle shows a trimalleolar ankle fracture  Labs: No results found for this or any previous visit (from the past 168 hour(s)).   Assessment/Plan: 67 year old male with left comminuted, displaced trimalleolar ankle fracture  Patient is significant  due to left lower extremity require surgical intervention.  Recommend proceeding with open reduction internal fixation of the left ankle.  Risks and benefits of procedure discussed with the patient. Risks discussed included bleeding requiring blood transfusion, bleeding causing a hematoma, infection, malunion, nonunion, damage to surrounding nerves and blood vessels, pain,  hardware prominence or irritation, hardware failure, stiffness, post-traumatic arthritis, DVT/PE, compartment syndrome, and even anesthesia complications.  Patient states his understanding of these risks and agrees to proceed with surgery.  Consent will be obtained.  We will plan to admit the patient overnight postoperatively for pain control and therapies.   Thompson Caul PA-C [facility] [phone] (office) [email or web address]

## 2023-12-01 ENCOUNTER — Other Ambulatory Visit: Payer: Self-pay

## 2023-12-01 ENCOUNTER — Encounter (HOSPITAL_COMMUNITY): Payer: Self-pay | Admitting: Student

## 2023-12-01 NOTE — Progress Notes (Signed)
PCP - Truett Perna, MD  Cardiologist -  NEUROLOGY Sam, Sherald Hess, MD      PPM/ICD - denies Device Orders - n/a Rep Notified - n/a  Chest x-ray -  EKG - DOS Stress Test -  ECHO -  Cardiac Cath -   CPAP - Denies  GLP-1 - denies  DM- denies being diabetic no longer takes medication. Per patient last A1c 5.4  Blood Thinner Instructions: denies Aspirin Instructions: aspirin EC instructed to patient to call surgeon's office for instructions  ERAS Protcol - NPO  COVID TEST- n/a  Anesthesia review: Yes hx of DM, HTN  Patient verbally denies any shortness of breath, fever, cough and chest pain during phone call   -------------  SDW INSTRUCTIONS given:  Your procedure is scheduled on December 02, 2023.  Report to [facility] Main Entrance "A" at 5:30 A.M., and check in at the Admitting office.  Call this number if you have problems the morning of surgery:  [phone]   Remember:  Do not eat  or drink after midnight the night before your surgery      Take these medicines the morning of surgery with A SIP OF WATER  fenofibrate  lamoTRIgine (LAMICTAL)  metoprolol tartrate (LOPRESSOR)  PARoxetine (PAXIL)  phenytoin (DILANTIN)  valACYclovir (VALTREX)   IF NEEDED clonazePAM (KLONOPIN)  oxyCODONE-acetaminophen (PERCOCET/ROXICET)   As of today, STOP taking any Aspirin (unless otherwise instructed by your surgeon) Aleve, Naproxen, Ibuprofen, Motrin, Advil, Goody's, BC's, all herbal medications, fish oil, and all vitamins.                       Do not wear jewelry, make up, or nail polish            Do not wear lotions, powders, perfumes/colognes, or deodorant.            Do not shave 48 hours prior to surgery.  Men may shave face and neck.            Do not bring valuables to the hospital.            [facility] is not responsible for any belongings or valuables.  Do NOT Smoke (Tobacco/Vaping) 24 hours prior to your procedure If you use a CPAP at night, you may bring  all equipment for your overnight stay.   Contacts, glasses, dentures or bridgework may not be worn into surgery.      For patients admitted to the hospital, discharge time will be determined by your treatment team.   Patients discharged the day of surgery will not be allowed to drive home, and someone needs to stay with them for 24 hours.    Special instructions:   [place]- Preparing For Surgery  Before surgery, you can play an important role. Because skin is not sterile, your skin needs to be as free of germs as possible. You can reduce the number of germs on your skin by washing with CHG (chlorahexidine gluconate) Soap before surgery.  CHG is an antiseptic cleaner which kills germs and bonds with the skin to continue killing germs even after washing.    Oral Hygiene is also important to reduce your risk of infection.  Remember - BRUSH YOUR TEETH THE MORNING OF SURGERY WITH YOUR REGULAR TOOTHPASTE  Please do not use if you have an allergy to CHG or antibacterial soaps. If your skin becomes reddened/irritated stop using the CHG.  Do not shave (including legs and underarms) for  at least 48 hours prior to first CHG shower. It is OK to shave your face.  Please follow these instructions carefully.   Shower the NIGHT BEFORE SURGERY and the MORNING OF SURGERY with DIAL Soap.   Pat yourself dry with a CLEAN TOWEL.  Wear CLEAN PAJAMAS to bed the night before surgery  Place CLEAN SHEETS on your bed the night of your first shower and DO NOT SLEEP WITH PETS.   Day of Surgery: Please shower morning of surgery  Wear Clean/Comfortable clothing the morning of surgery Do not apply any deodorants/lotions.   Remember to brush your teeth WITH YOUR REGULAR TOOTHPASTE.   Questions were answered. Patient verbalized understanding of instructions.

## 2023-12-02 ENCOUNTER — Observation Stay (HOSPITAL_COMMUNITY)
Admission: RE | Admit: 2023-12-02 | Discharge: 2023-12-03 | Disposition: A | Discharge status: Home / Self Care | Payer: Medicare PPO | Attending: Student | Admitting: Student

## 2023-12-02 ENCOUNTER — Encounter (HOSPITAL_COMMUNITY): Payer: Self-pay | Admitting: Student

## 2023-12-02 ENCOUNTER — Other Ambulatory Visit: Payer: Self-pay

## 2023-12-02 ENCOUNTER — Ambulatory Visit (HOSPITAL_COMMUNITY): Payer: Medicare PPO | Admitting: Physician Assistant

## 2023-12-02 ENCOUNTER — Inpatient Hospital Stay (HOSPITAL_COMMUNITY): Payer: Medicare PPO

## 2023-12-02 ENCOUNTER — Encounter (HOSPITAL_COMMUNITY)
Admission: RE | Disposition: A | Discharge status: Home / Self Care | Payer: Self-pay | Source: Home / Self Care | Attending: Student

## 2023-12-02 ENCOUNTER — Ambulatory Visit (HOSPITAL_COMMUNITY): Payer: Medicare PPO

## 2023-12-02 DIAGNOSIS — S82852A Displaced trimalleolar fracture of left lower leg, initial encounter for closed fracture: Secondary | ICD-10-CM

## 2023-12-02 DIAGNOSIS — R269 Unspecified abnormalities of gait and mobility: Secondary | ICD-10-CM | POA: Insufficient documentation

## 2023-12-02 DIAGNOSIS — W010XXA Fall on same level from slipping, tripping and stumbling without subsequent striking against object, initial encounter: Secondary | ICD-10-CM | POA: Diagnosis not present

## 2023-12-02 DIAGNOSIS — Y92019 Unspecified place in single-family (private) house as the place of occurrence of the external cause: Secondary | ICD-10-CM | POA: Diagnosis not present

## 2023-12-02 DIAGNOSIS — R2681 Unsteadiness on feet: Secondary | ICD-10-CM | POA: Diagnosis not present

## 2023-12-02 DIAGNOSIS — I10 Essential (primary) hypertension: Secondary | ICD-10-CM | POA: Insufficient documentation

## 2023-12-02 DIAGNOSIS — M6281 Muscle weakness (generalized): Secondary | ICD-10-CM | POA: Insufficient documentation

## 2023-12-02 DIAGNOSIS — E119 Type 2 diabetes mellitus without complications: Secondary | ICD-10-CM | POA: Diagnosis not present

## 2023-12-02 DIAGNOSIS — Z9181 History of falling: Secondary | ICD-10-CM | POA: Insufficient documentation

## 2023-12-02 DIAGNOSIS — E559 Vitamin D deficiency, unspecified: Secondary | ICD-10-CM | POA: Insufficient documentation

## 2023-12-02 DIAGNOSIS — S82851A Displaced trimalleolar fracture of right lower leg, initial encounter for closed fracture: Secondary | ICD-10-CM | POA: Diagnosis not present

## 2023-12-02 HISTORY — PX: ORIF ANKLE FRACTURE: SHX5408

## 2023-12-02 HISTORY — DX: Obsessive-compulsive disorder, unspecified: F42.9

## 2023-12-02 LAB — CBC
HCT: 35.7 % — ABNORMAL LOW (ref 39.0–52.0)
HCT: 32.7 % — ABNORMAL LOW (ref 39.0–52.0)
Hemoglobin: 12.3 g/dL — ABNORMAL LOW (ref 13.0–17.0)
Hemoglobin: 11.7 g/dL — ABNORMAL LOW (ref 13.0–17.0)
MCH: 34.3 pg — ABNORMAL HIGH (ref 26.0–34.0)
MCH: 35.2 pg — ABNORMAL HIGH (ref 26.0–34.0)
MCHC: 34.5 g/dL (ref 30.0–36.0)
MCHC: 35.8 g/dL (ref 30.0–36.0)
MCV: 99.4 fL (ref 80.0–100.0)
MCV: 98.5 fL (ref 80.0–100.0)
Platelets: 198 10*3/uL (ref 150–400)
Platelets: 207 10*3/uL (ref 150–400)
RBC: 3.59 MIL/uL — ABNORMAL LOW (ref 4.22–5.81)
RBC: 3.32 MIL/uL — ABNORMAL LOW (ref 4.22–5.81)
RDW: 12.5 % (ref 11.5–15.5)
RDW: 12.3 % (ref 11.5–15.5)
WBC: 6.4 10*3/uL (ref 4.0–10.5)
WBC: 6.3 10*3/uL (ref 4.0–10.5)
nRBC: 0 % (ref 0.0–0.2)
nRBC: 0 % (ref 0.0–0.2)

## 2023-12-02 LAB — BASIC METABOLIC PANEL
Anion gap: 8 (ref 5–15)
BUN: 13 mg/dL (ref 8–23)
CO2: 24 mmol/L (ref 22–32)
Calcium: 9 mg/dL (ref 8.9–10.3)
Chloride: 103 mmol/L (ref 98–111)
Creatinine, Ser: 0.83 mg/dL (ref 0.61–1.24)
GFR, Estimated: >60 mL/min (ref >60)
Glucose, Bld: 121 mg/dL — ABNORMAL HIGH (ref 70–99)
Potassium: 3.4 mmol/L — ABNORMAL LOW (ref 3.5–5.1)
Sodium: 135 mmol/L (ref 135–145)

## 2023-12-02 LAB — VITAMIN D 25 HYDROXY (VIT D DEFICIENCY, FRACTURES): Vit D, 25-Hydroxy: 21.82 ng/mL — ABNORMAL LOW (ref 30–100)

## 2023-12-02 LAB — GLUCOSE, CAPILLARY: Glucose-Capillary: 118 mg/dL — ABNORMAL HIGH (ref 70–99)

## 2023-12-02 SURGERY — OPEN REDUCTION INTERNAL FIXATION (ORIF) ANKLE FRACTURE
Anesthesia: Monitor Anesthesia Care | Site: Ankle | Laterality: Left

## 2023-12-02 MED ORDER — METHOCARBAMOL 1000 MG/10ML IJ SOLN: 500.0000 mg | Freq: Four times a day (QID) | INTRAMUSCULAR | Status: DC | PRN

## 2023-12-02 MED ORDER — DEXAMETHASONE SODIUM PHOSPHATE 10 MG/ML IJ SOLN
INTRAMUSCULAR | Status: DC | PRN
  Administered 2023-12-02 (×2): 10 mg via INTRAVENOUS

## 2023-12-02 MED ORDER — PROPOFOL 1000 MG/100ML IV EMUL
INTRAVENOUS | Status: AC
  Filled 2023-12-02: qty 100

## 2023-12-02 MED ORDER — ACETAMINOPHEN 10 MG/ML IV SOLN: 1000.0000 mg | Freq: Once | INTRAVENOUS | Status: DC | PRN

## 2023-12-02 MED ORDER — ONDANSETRON HCL 4 MG/2ML IJ SOLN
INTRAMUSCULAR | Status: AC
  Filled 2023-12-02: qty 2

## 2023-12-02 MED ORDER — PAROXETINE HCL 10 MG PO TABS
10.0000 mg | ORAL_TABLET | Freq: Every day | ORAL | Status: DC
  Administered 2023-12-03: 10 mg via ORAL
  Filled 2023-12-02: qty 1

## 2023-12-02 MED ORDER — PAROXETINE HCL 10 MG PO TABS: 10.0000 mg | ORAL_TABLET | Freq: Every morning | ORAL | Status: DC

## 2023-12-02 MED ORDER — ONDANSETRON HCL 4 MG PO TABS: 4.0000 mg | ORAL_TABLET | Freq: Four times a day (QID) | ORAL | Status: DC | PRN

## 2023-12-02 MED ORDER — LIDOCAINE 2% (20 MG/ML) 5 ML SYRINGE
INTRAMUSCULAR | Status: AC
Start: 2023-12-02 — End: ?
  Filled 2023-12-02: qty 5

## 2023-12-02 MED ORDER — SODIUM CHLORIDE 0.9% FLUSH
10.0000 mL | Freq: Two times a day (BID) | INTRAVENOUS | Status: DC
  Administered 2023-12-03: 10 mL via INTRAVENOUS

## 2023-12-02 MED ORDER — OXYCODONE HCL 5 MG PO TABS: 5.0000 mg | ORAL_TABLET | Freq: Once | ORAL | Status: DC | PRN

## 2023-12-02 MED ORDER — METOPROLOL TARTRATE 25 MG PO TABS
25.0000 mg | ORAL_TABLET | Freq: Two times a day (BID) | ORAL | Status: DC
  Administered 2023-12-02 – 2023-12-03 (×2): 25 mg via ORAL
  Filled 2023-12-02 (×2): qty 1

## 2023-12-02 MED ORDER — DOCUSATE SODIUM 100 MG PO CAPS
100.0000 mg | ORAL_CAPSULE | Freq: Two times a day (BID) | ORAL | Status: DC
  Administered 2023-12-02 – 2023-12-03 (×2): 100 mg via ORAL
  Filled 2023-12-02 (×2): qty 1

## 2023-12-02 MED ORDER — DIPHENHYDRAMINE HCL 12.5 MG/5ML PO ELIX
12.5000 mg | ORAL_SOLUTION | ORAL | Status: DC | PRN
  Administered 2023-12-02: 25 mg via ORAL
  Filled 2023-12-02 (×2): qty 10

## 2023-12-02 MED ORDER — FENTANYL CITRATE (PF) 250 MCG/5ML IJ SOLN
INTRAMUSCULAR | Status: AC
  Filled 2023-12-02: qty 5

## 2023-12-02 MED ORDER — HYDRALAZINE HCL 10 MG PO TABS: 10.0000 mg | ORAL_TABLET | Freq: Four times a day (QID) | ORAL | Status: DC | PRN

## 2023-12-02 MED ORDER — VALACYCLOVIR HCL 500 MG PO TABS
1000.0000 mg | ORAL_TABLET | Freq: Every morning | ORAL | Status: DC
  Administered 2023-12-03: 1000 mg via ORAL
  Filled 2023-12-02: qty 2

## 2023-12-02 MED ORDER — VANCOMYCIN HCL 1000 MG IV SOLR
INTRAVENOUS | Status: AC
Start: 2023-12-02 — End: ?
  Filled 2023-12-02: qty 20

## 2023-12-02 MED ORDER — ONDANSETRON HCL 4 MG/2ML IJ SOLN
INTRAMUSCULAR | Status: DC | PRN
  Administered 2023-12-02: 4 mg via INTRAVENOUS

## 2023-12-02 MED ORDER — METHOCARBAMOL 500 MG PO TABS
500.0000 mg | ORAL_TABLET | Freq: Four times a day (QID) | ORAL | Status: DC | PRN
  Administered 2023-12-02: 500 mg via ORAL
  Filled 2023-12-02: qty 1

## 2023-12-02 MED ORDER — METHOCARBAMOL 500 MG PO TABS: 500.0000 mg | ORAL_TABLET | Freq: Four times a day (QID) | ORAL | 0 refills | Status: DC | PRN

## 2023-12-02 MED ORDER — DEXAMETHASONE SODIUM PHOSPHATE 10 MG/ML IJ SOLN
INTRAMUSCULAR | Status: AC
  Filled 2023-12-02: qty 1

## 2023-12-02 MED ORDER — ACETAMINOPHEN 500 MG PO TABS
ORAL_TABLET | ORAL | Status: AC
  Filled 2023-12-02: qty 2

## 2023-12-02 MED ORDER — ACETAMINOPHEN 500 MG PO TABS
1000.0000 mg | ORAL_TABLET | Freq: Four times a day (QID) | ORAL | Status: DC
  Administered 2023-12-02 – 2023-12-03 (×3): 1000 mg via ORAL
  Filled 2023-12-02 (×4): qty 2

## 2023-12-02 MED ORDER — ORAL CARE MOUTH RINSE: 15.0000 mL | Freq: Once | OROMUCOSAL | Status: AC

## 2023-12-02 MED ORDER — OXYCODONE HCL 5 MG/5ML PO SOLN: 5.0000 mg | Freq: Once | ORAL | Status: DC | PRN

## 2023-12-02 MED ORDER — FENTANYL CITRATE (PF) 250 MCG/5ML IJ SOLN
INTRAMUSCULAR | Status: DC | PRN
  Administered 2023-12-02 (×3): 50 ug via INTRAVENOUS

## 2023-12-02 MED ORDER — ACETAMINOPHEN 500 MG PO TABS
1000.0000 mg | ORAL_TABLET | Freq: Once | ORAL | Status: AC | PRN
  Administered 2023-12-02: 1000 mg via ORAL

## 2023-12-02 MED ORDER — CLONAZEPAM 1 MG PO TABS
1.0000 mg | ORAL_TABLET | Freq: Three times a day (TID) | ORAL | Status: DC | PRN
  Administered 2023-12-02: 1 mg via ORAL
  Filled 2023-12-02: qty 1

## 2023-12-02 MED ORDER — OXYCODONE-ACETAMINOPHEN 5-325 MG PO TABS: 1.0000 | ORAL_TABLET | Freq: Four times a day (QID) | ORAL | 0 refills | Status: DC | PRN

## 2023-12-02 MED ORDER — PHENYTOIN SODIUM EXTENDED 100 MG PO CAPS: 200.0000 mg | ORAL_CAPSULE | ORAL | Status: DC

## 2023-12-02 MED ORDER — POLYETHYLENE GLYCOL 3350 17 G PO PACK: 17.0000 g | PACK | Freq: Every day | ORAL | Status: DC | PRN

## 2023-12-02 MED ORDER — POTASSIUM CHLORIDE CRYS ER 20 MEQ PO TBCR
20.0000 meq | EXTENDED_RELEASE_TABLET | Freq: Two times a day (BID) | ORAL | Status: DC
  Administered 2023-12-02 – 2023-12-03 (×2): 20 meq via ORAL
  Filled 2023-12-02 (×2): qty 1

## 2023-12-02 MED ORDER — ROSUVASTATIN CALCIUM 20 MG PO TABS
40.0000 mg | ORAL_TABLET | Freq: Every evening | ORAL | Status: DC
  Administered 2023-12-02: 40 mg via ORAL
  Filled 2023-12-02: qty 2

## 2023-12-02 MED ORDER — 0.9 % SODIUM CHLORIDE (POUR BTL) OPTIME
TOPICAL | Status: DC | PRN
  Administered 2023-12-02: 1000 mL

## 2023-12-02 MED ORDER — PAROXETINE HCL 20 MG PO TABS: 40.0000 mg | ORAL_TABLET | Freq: Every morning | ORAL | Status: DC

## 2023-12-02 MED ORDER — CEFAZOLIN SODIUM-DEXTROSE 2-4 GM/100ML-% IV SOLN
2.0000 g | Freq: Three times a day (TID) | INTRAVENOUS | Status: AC
  Administered 2023-12-02 – 2023-12-03 (×3): 2 g via INTRAVENOUS
  Filled 2023-12-02 (×3): qty 100

## 2023-12-02 MED ORDER — MIDAZOLAM HCL 2 MG/2ML IJ SOLN
INTRAMUSCULAR | Status: DC | PRN
  Administered 2023-12-02: 2 mg via INTRAVENOUS

## 2023-12-02 MED ORDER — IRBESARTAN 300 MG PO TABS
300.0000 mg | ORAL_TABLET | Freq: Every evening | ORAL | Status: DC
  Administered 2023-12-02: 300 mg via ORAL
  Filled 2023-12-02 (×2): qty 1

## 2023-12-02 MED ORDER — ACETAMINOPHEN 160 MG/5ML PO SOLN: 1000.0000 mg | Freq: Once | ORAL | Status: AC | PRN

## 2023-12-02 MED ORDER — CELECOXIB 200 MG PO CAPS
200.0000 mg | ORAL_CAPSULE | Freq: Two times a day (BID) | ORAL | Status: DC
  Administered 2023-12-02 – 2023-12-03 (×2): 200 mg via ORAL
  Filled 2023-12-02 (×2): qty 1

## 2023-12-02 MED ORDER — PROPOFOL 10 MG/ML IV BOLUS
INTRAVENOUS | Status: AC
  Filled 2023-12-02: qty 20

## 2023-12-02 MED ORDER — ONDANSETRON HCL 4 MG/2ML IJ SOLN: 4.0000 mg | Freq: Four times a day (QID) | INTRAMUSCULAR | Status: DC | PRN

## 2023-12-02 MED ORDER — INSULIN ASPART 100 UNIT/ML IJ SOLN: 0.0000 [IU] | INTRAMUSCULAR | Status: DC | PRN

## 2023-12-02 MED ORDER — HYDROMORPHONE HCL 1 MG/ML IJ SOLN: 0.5000 mg | INTRAMUSCULAR | Status: DC | PRN

## 2023-12-02 MED ORDER — PHENYTOIN SODIUM EXTENDED 100 MG PO CAPS
300.0000 mg | ORAL_CAPSULE | Freq: Every day | ORAL | Status: DC
  Administered 2023-12-02: 300 mg via ORAL
  Filled 2023-12-02 (×2): qty 3

## 2023-12-02 MED ORDER — VANCOMYCIN HCL 1000 MG IV SOLR
INTRAVENOUS | Status: DC | PRN
  Administered 2023-12-02: 1000 mg via TOPICAL

## 2023-12-02 MED ORDER — LIDOCAINE 2% (20 MG/ML) 5 ML SYRINGE
INTRAMUSCULAR | Status: DC | PRN
  Administered 2023-12-02: 50 mg via INTRAVENOUS

## 2023-12-02 MED ORDER — FENTANYL CITRATE (PF) 100 MCG/2ML IJ SOLN: 25.0000 ug | INTRAMUSCULAR | Status: DC | PRN

## 2023-12-02 MED ORDER — PAROXETINE HCL 20 MG PO TABS
40.0000 mg | ORAL_TABLET | Freq: Every day | ORAL | Status: DC
  Administered 2023-12-03: 40 mg via ORAL
  Filled 2023-12-02: qty 2

## 2023-12-02 MED ORDER — CEFAZOLIN SODIUM-DEXTROSE 2-4 GM/100ML-% IV SOLN
2.0000 g | INTRAVENOUS | Status: AC
  Administered 2023-12-02: 2 g via INTRAVENOUS
  Filled 2023-12-02: qty 100

## 2023-12-02 MED ORDER — LIDOCAINE-EPINEPHRINE (PF) 1.5 %-1:200000 IJ SOLN
INTRAMUSCULAR | Status: DC | PRN
  Administered 2023-12-02 (×2): 5 mL via PERINEURAL

## 2023-12-02 MED ORDER — METOCLOPRAMIDE HCL 5 MG PO TABS: 5.0000 mg | ORAL_TABLET | Freq: Three times a day (TID) | ORAL | Status: DC | PRN

## 2023-12-02 MED ORDER — CHLORHEXIDINE GLUCONATE 0.12 % MT SOLN
15.0000 mL | Freq: Once | OROMUCOSAL | Status: AC
  Administered 2023-12-02: 15 mL via OROMUCOSAL
  Filled 2023-12-02: qty 15

## 2023-12-02 MED ORDER — PROPOFOL 10 MG/ML IV BOLUS
INTRAVENOUS | Status: DC | PRN
  Administered 2023-12-02: 120 mg via INTRAVENOUS
  Administered 2023-12-02: 125 ug/kg/min via INTRAVENOUS

## 2023-12-02 MED ORDER — METOCLOPRAMIDE HCL 5 MG/ML IJ SOLN
5.0000 mg | Freq: Three times a day (TID) | INTRAMUSCULAR | Status: DC | PRN
Start: 2023-12-02 — End: 2023-12-03

## 2023-12-02 MED ORDER — PHENYTOIN SODIUM EXTENDED 100 MG PO CAPS
200.0000 mg | ORAL_CAPSULE | Freq: Every day | ORAL | Status: DC
  Administered 2023-12-03: 200 mg via ORAL
  Filled 2023-12-02: qty 2

## 2023-12-02 MED ORDER — ASPIRIN 81 MG PO TBEC
81.0000 mg | DELAYED_RELEASE_TABLET | Freq: Every morning | ORAL | Status: DC
  Administered 2023-12-03: 81 mg via ORAL
  Filled 2023-12-02: qty 1

## 2023-12-02 MED ORDER — OXYCODONE HCL 5 MG PO TABS
5.0000 mg | ORAL_TABLET | ORAL | Status: DC | PRN
  Administered 2023-12-02 – 2023-12-03 (×3): 10 mg via ORAL
  Filled 2023-12-02 (×3): qty 2

## 2023-12-02 MED ORDER — BUPIVACAINE-EPINEPHRINE (PF) 0.5% -1:200000 IJ SOLN
INTRAMUSCULAR | Status: DC | PRN
  Administered 2023-12-02: 5 mL via PERINEURAL
  Administered 2023-12-02: 25 mL via PERINEURAL

## 2023-12-02 MED ORDER — LACTATED RINGERS IV SOLN: INTRAVENOUS | Status: DC

## 2023-12-02 MED ORDER — LAMOTRIGINE 100 MG PO TABS
100.0000 mg | ORAL_TABLET | Freq: Every morning | ORAL | Status: DC
  Administered 2023-12-03: 100 mg via ORAL
  Filled 2023-12-02: qty 1

## 2023-12-02 MED ORDER — DEXMEDETOMIDINE HCL IN NACL 80 MCG/20ML IV SOLN
INTRAVENOUS | Status: DC | PRN
Start: 2023-12-02 — End: 2023-12-02
  Administered 2023-12-02: 10 ug via INTRAVENOUS

## 2023-12-02 MED ORDER — FENOFIBRATE 160 MG PO TABS
160.0000 mg | ORAL_TABLET | Freq: Every morning | ORAL | Status: DC
  Administered 2023-12-03: 160 mg via ORAL
  Filled 2023-12-02: qty 1

## 2023-12-02 MED ORDER — MIDAZOLAM HCL 2 MG/2ML IJ SOLN
INTRAMUSCULAR | Status: AC
  Filled 2023-12-02: qty 2

## 2023-12-02 SURGICAL SUPPLY — 67 items
BAG COUNTER SPONGE SURGICOUNT (BAG) ×2 IMPLANT
BANDAGE ESMARK 6X9 LF (GAUZE/BANDAGES/DRESSINGS) ×2 IMPLANT
BIT DRILL QC 2.0 SHORT EVOS SM (DRILL) IMPLANT
BIT DRILL QC 2.5MM SHRT EVO SM (DRILL) IMPLANT
BNDG COHESIVE 4X5 TAN STRL (GAUZE/BANDAGES/DRESSINGS) ×2 IMPLANT
BNDG ELASTIC 4INX 5YD STR LF (GAUZE/BANDAGES/DRESSINGS) IMPLANT
BNDG ELASTIC 4X5.8 VLCR STR LF (GAUZE/BANDAGES/DRESSINGS) IMPLANT
BNDG ELASTIC 6INX 5YD STR LF (GAUZE/BANDAGES/DRESSINGS) IMPLANT
BNDG ELASTIC 6X5.8 VLCR STR LF (GAUZE/BANDAGES/DRESSINGS) IMPLANT
BNDG ESMARK 6X9 LF (GAUZE/BANDAGES/DRESSINGS) IMPLANT
BRUSH SCRUB EZ PLAIN DRY (MISCELLANEOUS) ×4 IMPLANT
CHLORAPREP W/TINT 26 (MISCELLANEOUS) ×2 IMPLANT
COVER SURGICAL LIGHT HANDLE (MISCELLANEOUS) ×2 IMPLANT
DRAPE C-ARM 42X72 X-RAY (DRAPES) ×2 IMPLANT
DRAPE C-ARMOR (DRAPES) ×2 IMPLANT
DRAPE SURG ORHT 6 SPLT 77X108 (DRAPES) ×4 IMPLANT
DRAPE U-SHAPE 47X51 STRL (DRAPES) ×2 IMPLANT
DRILL QC 2.0 SHORT EVOS SM (DRILL) ×1 IMPLANT
DRILL QC 2.5MM SHORT EVOS SM (DRILL) ×1 IMPLANT
DRSG ADAPTIC 3X8 NADH LF (GAUZE/BANDAGES/DRESSINGS) IMPLANT
DRSG MEPITEL 4X7.2 (GAUZE/BANDAGES/DRESSINGS) IMPLANT
ELECT REM PT RETURN 9FT ADLT (ELECTROSURGICAL) ×1 IMPLANT
ELECTRODE REM PT RTRN 9FT ADLT (ELECTROSURGICAL) ×2 IMPLANT
GAUZE PAD ABD 8X10 STRL (GAUZE/BANDAGES/DRESSINGS) IMPLANT
GAUZE SPONGE 4X4 12PLY STRL (GAUZE/BANDAGES/DRESSINGS) IMPLANT
GLOVE BIO SURGEON STRL SZ 6.5 (GLOVE) ×6 IMPLANT
GLOVE BIO SURGEON STRL SZ7.5 (GLOVE) ×6 IMPLANT
GLOVE BIOGEL PI IND STRL 6.5 (GLOVE) ×2 IMPLANT
GLOVE BIOGEL PI IND STRL 7.5 (GLOVE) ×2 IMPLANT
GOWN STRL REUS W/ TWL LRG LVL3 (GOWN DISPOSABLE) ×4 IMPLANT
KIT TURNOVER KIT B (KITS) ×2 IMPLANT
MANIFOLD NEPTUNE II (INSTRUMENTS) ×2 IMPLANT
NDL HYPO 21X1.5 SAFETY (NEEDLE) IMPLANT
NDL HYPO 25GX1X1/2 BEV (NEEDLE) ×2 IMPLANT
NEEDLE HYPO 21X1.5 SAFETY (NEEDLE) IMPLANT
NEEDLE HYPO 25GX1X1/2 BEV (NEEDLE) ×1 IMPLANT
NS IRRIG 1000ML POUR BTL (IV SOLUTION) ×2 IMPLANT
PACK TOTAL JOINT (CUSTOM PROCEDURE TRAY) ×2 IMPLANT
PAD ARMBOARD 7.5X6 YLW CONV (MISCELLANEOUS) ×4 IMPLANT
PAD CAST 4YDX4 CTTN HI CHSV (CAST SUPPLIES) IMPLANT
PADDING CAST ABS COTTON 4X4 ST (CAST SUPPLIES) IMPLANT
PADDING CAST COTTON 6X4 STRL (CAST SUPPLIES) IMPLANT
PLATE 5H L 81MM FIBULA EVOS (Plate) IMPLANT
SCREW CORT 2.7X16 STAR T8 EVOS (Screw) IMPLANT
SCREW CORT 2.7X20 T8 ST EVOS (Screw) IMPLANT
SCREW CORT 3.5X15 ST EVOS (Screw) IMPLANT
SCREW CORT EVOS ST 3.5X12 (Screw) IMPLANT
SCREW CORT ST EVOS 3.5X55 (Screw) IMPLANT
SCREW CORT ST EVOS 3.5X60 (Screw) IMPLANT
SCREW CORT ST EVOS 3.5X65 (Screw) IMPLANT
SCREW CORT ST EVOS 3.5X70 (Screw) IMPLANT
SPONGE T-LAP 18X18 ~~LOC~~+RFID (SPONGE) IMPLANT
STAPLER VISISTAT 35W (STAPLE) ×2 IMPLANT
SUCTION TUBE FRAZIER 10FR DISP (SUCTIONS) ×2 IMPLANT
SUT ETHILON 3 0 PS 1 (SUTURE) ×4 IMPLANT
SUT MNCRL AB 3-0 PS2 18 (SUTURE) IMPLANT
SUT MNCRL+ AB 3-0 CT1 36 (SUTURE) IMPLANT
SUT MON AB 2-0 CT1 36 (SUTURE) IMPLANT
SUT MON AB 2-0 SH27 (SUTURE) IMPLANT
SUT PROLENE 0 CT (SUTURE) IMPLANT
SUT VIC AB 0 CT1 27XBRD ANBCTR (SUTURE) ×2 IMPLANT
SUT VIC AB 2-0 CT1 TAPERPNT 27 (SUTURE) ×4 IMPLANT
SYR CONTROL 10ML LL (SYRINGE) ×2 IMPLANT
TOWEL GREEN STERILE (TOWEL DISPOSABLE) ×4 IMPLANT
TOWEL GREEN STERILE FF (TOWEL DISPOSABLE) ×2 IMPLANT
UNDERPAD 30X36 HEAVY ABSORB (UNDERPADS AND DIAPERS) ×2 IMPLANT
WATER STERILE IRR 1000ML POUR (IV SOLUTION) ×2 IMPLANT

## 2023-12-02 NOTE — Evaluation (Signed)
Physical Therapy Evaluation Patient Details Name: Theodore Gutierrez MRN: [id number] DOB: 03-02-1956 Today's Date: 12/02/2023  History of Present Illness  Pt is a 67 y.o. male who presented 12/02/23 for presenting for L ankle ORIF. Patient fell at home on 11/27/2023 and sustained a L trimalleolar ankle fx. PMH: anxiety, depression, DM, HTN, HLD, OCD, seizures   Clinical Impression  Pt presents with condition above and deficits mentioned below, see PT Problem List. PTA, he was independent, working in [employer], and living with his brother in a 2-level house with x2 STE. He does not need to go upstairs. Since he fractured his ankle, pt reports x4-5 falls "this week". He reports he has not been fully compliant with being NWB this past week. He has multiple bruises on his bil legs. Currently, pt demonstrates deficits in bil lower extremity strength, impacting his ease with transfers, stability when ambulating, and ability to hold his L foot off the ground to maintain NWB precautions. He required the EOB to be elevated and modA to power up to stand from sit. He also maintained a flexed posture in his trunk and R knee when ambulating up to ~30 ft with a RW and minA for balance. He is at high risk for falls and further injuries. He reports he has even caused his brother to fall with him when his brother attempted to assist him with mobility at home since he fractured his ankle. He reports particular difficulty with the x2 STE. Pt could greatly benefit from intensive inpatient rehab, > 3 hours/day, prior to d/c home to maximize his independence and safety with functional mobility to reduce his risk for further falls and to reduce his risk for injuries to himself or to his brother. Will continue to follow acutely.      If plan is discharge home, recommend the following: A lot of help with walking and/or transfers;A little help with bathing/dressing/bathroom;Assistance with cooking/housework;Assist for transportation;Help  with stairs or ramp for entrance   Can travel by private vehicle        Equipment Recommendations BSC/3in1;Wheelchair (measurements PT);Wheelchair cushion (measurements PT)  Recommendations for Other Services  Rehab consult    Functional Status Assessment Patient has had a recent decline in their functional status and demonstrates the ability to make significant improvements in function in a reasonable and predictable amount of time.     Precautions / Restrictions Precautions Precautions: Fall Precaution Comments: hx of falls PTA Restrictions Weight Bearing Restrictions: Yes LLE Weight Bearing: Non weight bearing      Mobility  Bed Mobility Overal bed mobility: Needs Assistance Bed Mobility: Supine to Sit, Sit to Supine     Supine to sit: Supervision, HOB elevated, Used rails Sit to supine: Supervision, HOB elevated, Used rails   General bed mobility comments: Cues for pt to avoid pushing off NWB LLE to complete bed mobility    Transfers Overall transfer level: Needs assistance Equipment used: Rolling walker (2 wheels) Transfers: Sit to/from Stand Sit to Stand: From elevated surface, Mod assist           General transfer comment: cues needed to extend LLE to maintain NWB LLE and cues needed for hand placement with safe transfer. Bed needed to be elevated for pt to be successful in standing with modA to power up and gain balance.    Ambulation/Gait Ambulation/Gait assistance: Min assist, +2 safety/equipment Gait Distance (Feet): 30 Feet Assistive device: Rolling walker (2 wheels) Gait Pattern/deviations: Knee flexed in stance - right, Trunk flexed (hop-to)  Gait velocity: reduced Gait velocity interpretation: <1.31 ft/sec, indicative of household ambulator   General Gait Details: Pt ambulates unsteadily, hopping on his R foot with a flexed posture at his trunk and R knee. Tactile cues provided at R quads to extend during stance phase. VCs provided to extend  trunk.Momentary success noted. MinA for balance, +2 for safety  Stairs            Wheelchair Mobility     Tilt Bed    Modified Rankin (Stroke Patients Only)       Balance Overall balance assessment: Needs assistance Sitting-balance support: Feet supported, No upper extremity supported Sitting balance-Leahy Scale: Fair Sitting balance - Comments: sitting EOB   Standing balance support: Bilateral upper extremity supported, During functional activity, Reliant on assistive device for balance Standing balance-Leahy Scale: Poor Standing balance comment: Reliant on RW                             Pertinent Vitals/Pain Pain Assessment Pain Assessment: Faces Faces Pain Scale: No hurt Pain Intervention(s): Monitored during session    Home Living Family/patient expects to be discharged to:: Private residence Living Arrangements: Other relatives (lives with brother [name]) Available Help at Discharge: Available 24 hours/day (he reports ability to have 24/7 assist at home if needed) Type of Home: House Home Access: Stairs to enter Entrance Stairs-Rails: None (pt reports having a fence and gate nearby but they are not as reliable.) Entrance Stairs-Number of Steps: 2 Alternate Level Stairs-Number of Steps: n/a. stays downstairs Home Layout: Two level;Able to live on main level with bedroom/bathroom Home Equipment: Rolling Walker (2 wheels);Crutches Additional Comments: Pt works in [employer]- can work from home. Pt reports being very active prior to incidence. pt reprots 4-5 falls "this week"    Prior Function Prior Level of Function : Independent/Modified Independent;Working/employed;Driving;History of Falls (last six months)             Mobility Comments: Ind with RW after fx, has fallen multiple times since fx ADLs Comments: Ind     Extremity/Trunk Assessment   Upper Extremity Assessment Upper Extremity Assessment: Defer to OT evaluation    Lower Extremity  Assessment Lower Extremity Assessment: Generalized weakness;LLE deficits/detail (bil; Some brusies on bil legs from falls this week) LLE Deficits / Details: s/p ORIF of L ankle fx, still numb and unable to wiggle toes    Cervical / Trunk Assessment Cervical / Trunk Assessment: Normal  Communication   Communication Communication: No apparent difficulties Cueing Techniques: Verbal cues;Gestural cues  Cognition Arousal: Alert Behavior During Therapy: WFL for tasks assessed/performed Overall Cognitive Status: Difficult to assess                                 General Comments: Pt may still be under the effects of anesthesia, a bit spacy and needs cues to redirect to main topics        General Comments General comments (skin integrity, edema, etc.): Some brusies on RLE, per pt he had once had to crawl 20yds on asphalt. LLE elevated on pillows to improve swelling, reinforced straight leg to reduce risk of flexion contracture; pt educated on his risk for falls    Exercises     Assessment/Plan    PT Assessment Patient needs continued PT services  PT Problem List Decreased strength;Decreased activity tolerance;Decreased balance;Decreased mobility;Decreased cognition;Decreased knowledge of precautions;Decreased safety awareness;Impaired sensation  PT Treatment Interventions DME instruction;Gait training;Stair training;Functional mobility training;Therapeutic activities;Therapeutic exercise;Balance training;Neuromuscular re-education;Cognitive remediation;Patient/family education    PT Goals (Current goals can be found in the Care Plan section)  Acute Rehab PT Goals Patient Stated Goal: to go home PT Goal Formulation: With patient Time For Goal Achievement: 12/16/23 Potential to Achieve Goals: Good    Frequency Min 1X/week     Co-evaluation PT/OT/SLP Co-Evaluation/Treatment: Yes Reason for Co-Treatment: Complexity of the patient's impairments (multi-system  involvement);To address functional/ADL transfers;For patient/therapist safety PT goals addressed during session: Mobility/safety with mobility;Balance;Proper use of DME OT goals addressed during session: ADL's and self-care       AM-PAC PT "6 Clicks" Mobility  Outcome Measure Help needed turning from your back to your side while in a flat bed without using bedrails?: A Little Help needed moving from lying on your back to sitting on the side of a flat bed without using bedrails?: A Little Help needed moving to and from a bed to a chair (including a wheelchair)?: A Lot Help needed standing up from a chair using your arms (e.g., wheelchair or bedside chair)?: A Lot Help needed to walk in hospital room?: A Little Help needed climbing 3-5 steps with a railing? : Total 6 Click Score: 14    End of Session Equipment Utilized During Treatment: Gait belt Activity Tolerance: Patient tolerated treatment well Patient left: in bed;with call bell/phone within reach;with bed alarm set   PT Visit Diagnosis: Unsteadiness on feet (R26.81);Other abnormalities of gait and mobility (R26.89);Muscle weakness (generalized) (M62.81);History of falling (Z91.81);Difficulty in walking, not elsewhere classified (R26.2);Repeated falls (R29.6)    Time: 1610-9604 PT Time Calculation (min) (ACUTE ONLY): 28 min   Charges:   PT Evaluation $PT Eval Moderate Complexity: 1 Mod   PT General Charges $$ ACUTE PT VISIT: 1 Visit         Virgil Benedict, PT, DPT Acute Rehabilitation Services  Office: [phone]   Bettina Gavia 12/02/2023, 6:41 PM

## 2023-12-02 NOTE — Plan of Care (Signed)

## 2023-12-02 NOTE — Interval H&P Note (Signed)
History and Physical Interval Note:  12/02/2023 7:26 AM  Theodore Gutierrez  has presented today for surgery, with the diagnosis of Left trimalleolar ankle fracture.  The various methods of treatment have been discussed with the patient and family. After consideration of risks, benefits and other options for treatment, the patient has consented to  Procedure(s): OPEN REDUCTION INTERNAL FIXATION (ORIF) ANKLE FRACTURE (Left) as a surgical intervention.  The patient's history has been reviewed, patient examined, no change in status, stable for surgery.  I have reviewed the patient's chart and labs.  Questions were answered to the patient's satisfaction.     Caryn Bee P Arthea Nobel

## 2023-12-02 NOTE — Discharge Instructions (Signed)
Truitt Merle, MD Thyra Breed PA-C [facility] [address] [phone] (tel)   [phone] (fax)                                  POST-OPERATIVE INSTRUCTIONS     WEIGHT BEARING STATUS: Non-weightbearing left lower extremity  RANGE OF MOTION/ACTIVITY:  Ok for knee motion. Do not remove splint  WOUND CARE Please keep splint clean dry and intact until follow-up. If your splint gets wet for any reason please contact the office immediately.  Do not stick anything down your splint such as pencils, momey, hangers to try and scratch yourself.  If you feel itchy take Benadryl as prescribed on the bottle for itching You may shower on Post-Op Day #2.  You must keep splint dry during this process and may find that a plastic bag taped around the extremity or alternatively a towel based bath may be a better option.   If you get your splint wet or if it is damaged please contact our clinic.  EXERCISES Due to your splint being in place you will not be able to bear weight through your extremity.   DO NOT PUT ANY WEIGHT ON YOUR OPERATIVE LEG Please use crutches or a walker to avoid weight bearing.   DVT/PE prophylaxis: Aspirin 81 mg daily x 30 days  DIET: As you were eating previously.  Can use over the counter stool softeners and bowel preparations, such as Miralax, to help with bowel movements.  Narcotics can be constipating.  Be sure to drink plenty of fluids  REGIONAL ANESTHESIA (NERVE BLOCKS) The anesthesia team may have performed a nerve block for you if safe in the setting of your care.  This is a great tool used to minimize pain.  Typically the block may start wearing off overnight but the long acting medicine may last for 3-4 days.  The nerve block wearing off can be a challenging period but please utilize your as needed pain medications to try and manage this period.    POST-OP MEDICATIONS- Multimodal approach to pain control  In general your pain will be  controlled with a combination of substances.  Prescriptions unless otherwise discussed are electronically sent to your pharmacy.  This is a carefully made plan we use to minimize narcotic use.     - Acetaminophen - Non-narcotic pain medicine taken on a scheduled basis   - Oxycodone - This is a strong narcotic, to be used only on an "as needed" basis for pain.  -  Aspirin 81mg  - This medicine is used to minimize the risk of blood clots after surgery.         FOLLOW-UP If you develop a Fever (>101.5), Redness or Drainage from the surgical incision site, please call our office to arrange for an evaluation. Please call the office to schedule a follow-up appointment for your incision check if you do not already have one, 7-10 days post-operatively.   VISIT OUR WEBSITE FOR ADDITIONAL INFORMATION: [email or web address]   HELPFUL INFORMATION  If you had a block, it will wear off between 8-24 hrs postop typically.  This is period when your pain may go from nearly zero to the pain you would have had postop without the block.  This is an abrupt transition but nothing dangerous is happening.  You may take an extra dose of narcotic when this happens.  You should wean off your narcotic medicines  as soon as you are able.  Most patients will be off or using minimal narcotics before their first postop appointment.   We suggest you use the pain medication the first night prior to going to bed, in order to ease any pain when the anesthesia wears off. You should avoid taking pain medications on an empty stomach as it will make you nauseous.  Do not drink alcoholic beverages or take illicit drugs when taking pain medications.  In most states it is against the law to drive while you are in a splint or sling.  And certainly against the law to drive while taking narcotics.  You may return to work/school in the next couple of days when you feel up to it.   Pain medication may make you constipated.  Below are a few  solutions to try in this order: Decrease the amount of pain medication if you aren't having pain. Drink lots of decaffeinated fluids. Drink prune juice and/or each dried prunes  If the first 3 don't work start with additional solutions Take Colace - an over-the-counter stool softener Take Senokot - an over-the-counter laxative Take Miralax - a stronger over-the-counter laxative

## 2023-12-02 NOTE — Plan of Care (Signed)

## 2023-12-02 NOTE — Anesthesia Postprocedure Evaluation (Signed)
Anesthesia Post Note  Patient: Theodore Gutierrez  Procedure(s) Performed: OPEN REDUCTION INTERNAL FIXATION (ORIF) ANKLE FRACTURE (Left: Ankle)     Patient location during evaluation: PACU Anesthesia Type: Regional and MAC Level of consciousness: awake and alert Pain management: pain level controlled Vital Signs Assessment: post-procedure vital signs reviewed and stable Respiratory status: spontaneous breathing, nonlabored ventilation and respiratory function stable Cardiovascular status: stable and blood pressure returned to baseline Postop Assessment: no apparent nausea or vomiting Anesthetic complications: no   No notable events documented.          Ravenna Legore

## 2023-12-02 NOTE — Anesthesia Procedure Notes (Signed)
Anesthesia Regional Block: Adductor canal block   Pre-Anesthetic Checklist: , timeout performed,  Correct Patient, Correct Site, Correct Laterality,  Correct Procedure, Correct Position, site marked,  Risks and benefits discussed,  Surgical consent,  Pre-op evaluation,  At surgeon's request and post-op pain management  Laterality: Left and Lower  Prep: chloraprep       Needles:  Injection technique: Single-shot      Needle Length: 9cm  Needle Gauge: 22     Additional Needles: Arrow StimuQuik ECHO Echogenic Stimulating PNB Needle  Procedures:,,,, ultrasound used (permanent image in chart),,    Narrative:  Start time: 12/02/2023 7:28 AM End time: 12/02/2023 7:30 AM Injection made incrementally with aspirations every 5 mL.  Performed by: Personally  Anesthesiologist: Val Eagle, MD

## 2023-12-02 NOTE — Transfer of Care (Signed)
Immediate Anesthesia Transfer of Care Note  Patient: MCHENRY LABA  Procedure(s) Performed: OPEN REDUCTION INTERNAL FIXATION (ORIF) ANKLE FRACTURE (Left: Ankle)  Patient Location: PACU  Anesthesia Type:MAC and Regional  Level of Consciousness: awake, alert , and oriented  Airway & Oxygen Therapy: Patient Spontanous Breathing and Patient connected to face mask oxygen  Post-op Assessment: Report given to RN and Post -op Vital signs reviewed and stable  Post vital signs: Reviewed and stable  Last Vitals:  Vitals Value Taken Time  BP 122/89 12/02/23 0900  Temp 97.3   Pulse 93 12/02/23 0902  Resp 12   SpO2 92 % 12/02/23 0902  Vitals shown include unfiled device data.  Last Pain:  Vitals:   12/02/23 0653  TempSrc:   PainSc: 6       Patients Stated Pain Goal: 2 (12/02/23 3016)  Complications: No notable events documented.

## 2023-12-02 NOTE — Anesthesia Procedure Notes (Signed)
Anesthesia Regional Block: Popliteal block   Pre-Anesthetic Checklist: , timeout performed,  Correct Patient, Correct Site, Correct Laterality,  Correct Procedure, Correct Position, site marked,  Risks and benefits discussed,  Surgical consent,  Pre-op evaluation,  At surgeon's request and post-op pain management  Laterality: Left and Lower  Prep: chloraprep       Needles:  Injection technique: Single-shot      Needle Length: 9cm  Needle Gauge: 22     Additional Needles: Arrow StimuQuik ECHO Echogenic Stimulating PNB Needle  Procedures:,,,, ultrasound used (permanent image in chart),,    Narrative:  Start time: 12/02/2023 7:30 AM End time: 12/02/2023 7:32 AM Injection made incrementally with aspirations every 5 mL.  Performed by: Personally  Anesthesiologist: Val Eagle, MD

## 2023-12-02 NOTE — Evaluation (Signed)
Occupational Therapy Evaluation Patient Details Name: Theodore Gutierrez MRN: [id number] DOB: 1956-07-12 Today's Date: 12/02/2023   History of Present Illness 67 y.o. male presenting for surgery on left lower extremity.  Patient sustained fall at home on 11/27/2023, landing on his left leg.  Had immediate pain in the left ankle and foot and was unable to weight-bear. MRI showed L trimalleolar ankle fx, he is now s/p L ORIF ankle on 12/12/23 and NWB on LLE. Past medical history significant for anxiety, depression, diabetes, hyperlipidemia, hypertension   Clinical Impression   Pt admitted for above, he reports frequent falls PTA and is now NWB on his LLE. Pt needing Min A to ambulate with RW, he lives with his brother but reports sometimes they may fall together. Pt able to complete seated ADLs with min A to setup assist, more assist needed for toileting and standing ADLs. At baseline pt reports being very active,  ambulating 2 miles per day and being fully independent. Pt would benefit from continued acute skilled OT services to address deficits and help transition to next level of care. Patient has the potential to reach Mod I and demos the ability to tolerate 3 hours of therapy. Pt would benefit from an intensive rehab program to help maximize functional independence.       If plan is discharge home, recommend the following: A little help with bathing/dressing/bathroom;A little help with walking and/or transfers;Assistance with cooking/housework;Assist for transportation;Help with stairs or ramp for entrance    Functional Status Assessment  Patient has had a recent decline in their functional status and demonstrates the ability to make significant improvements in function in a reasonable and predictable amount of time.  Equipment Recommendations  BSC/3in1;Tub/shower seat    Recommendations for Other Services Rehab consult     Precautions / Restrictions Precautions Precautions:  Fall Precaution Comments: hx of falls PTA Restrictions Weight Bearing Restrictions: Yes LLE Weight Bearing: Non weight bearing      Mobility Bed Mobility Overal bed mobility: Needs Assistance Bed Mobility: Supine to Sit, Sit to Supine     Supine to sit: Supervision, HOB elevated, Used rails Sit to supine: Supervision, HOB elevated, Used rails   General bed mobility comments: Cues for pt to avoid pushing off NWB LLE to complete bed mobility    Transfers Overall transfer level: Needs assistance Equipment used: Rolling walker (2 wheels) Transfers: Sit to/from Stand Sit to Stand: From elevated surface, Mod assist           General transfer comment: cues needed to extend LLE to maintain NWB LLE and cues needed for hand placement with safe transfer      Balance Overall balance assessment: Needs assistance Sitting-balance support: Feet supported, No upper extremity supported Sitting balance-Leahy Scale: Fair Sitting balance - Comments: sitting EOB   Standing balance support: Bilateral upper extremity supported, During functional activity, Reliant on assistive device for balance Standing balance-Leahy Scale: Poor Standing balance comment: Reliant on RW                           ADL either performed or assessed with clinical judgement   ADL Overall ADL's : Needs assistance/impaired Eating/Feeding: Independent;Sitting   Grooming: Sitting;Set up   Upper Body Bathing: Sitting;Set up   Lower Body Bathing: Sitting/lateral leans;Minimal assistance   Upper Body Dressing : Sitting;Set up   Lower Body Dressing: Sitting/lateral leans;Set up Lower Body Dressing Details (indicate cue type and reason): Able to don L  shoe with setup. Anticipate need for significant assist for pants Toilet Transfer: Minimal assistance;Rolling walker (2 wheels);Ambulation;BSC/3in1 Statistician Details (indicate cue type and reason): BSC needed to raise surface Toileting- Clothing  Manipulation and Hygiene: Sit to/from stand;Maximal assistance       Functional mobility during ADLs: Minimal assistance;Rolling walker (2 wheels)       Vision         Perception         Praxis         Pertinent Vitals/Pain Pain Assessment Pain Assessment: Faces Faces Pain Scale: No hurt     Extremity/Trunk Assessment Upper Extremity Assessment Upper Extremity Assessment: Overall WFL for tasks assessed (would benefit from additional UE strengthening for increased endurance)   Lower Extremity Assessment Lower Extremity Assessment: Defer to PT evaluation       Communication Communication Communication: No apparent difficulties Cueing Techniques: Verbal cues;Gestural cues   Cognition Arousal: Alert Behavior During Therapy: WFL for tasks assessed/performed Overall Cognitive Status: Difficult to assess                                 General Comments: Pt may still be under the effects of anesthesia, a bit spacy and needs cues to redirect to main topics     General Comments  Some brusies on RLE, per pt he had once had to crawl 20yds on asphalt. LLE elevated on pillows to improve swelling, reinforced straight leg to reduce risk of flexion contracture    Exercises     Shoulder Instructions      Home Living Family/patient expects to be discharged to:: Private residence Living Arrangements: Other relatives (lives with brother Theodore Gutierrez) Available Help at Discharge: Available 24 hours/day (he reports ability to have 24/7 assist at home if needed) Type of Home: House Home Access: Stairs to enter [employer] of Steps: 2 Entrance Stairs-Rails: None (pt reports having a fence and gate nearby but they are not as reliable.) Home Layout: Two level;Able to live on main level with bedroom/bathroom Alternate Level Stairs-Number of Steps: n/a. stays downstairs   Bathroom Shower/Tub: Walk-in shower (6 inch step)   Bathroom Toilet: Standard Bathroom  Accessibility: Yes How Accessible: Accessible via walker Home Equipment: Rolling Walker (2 wheels);Crutches   Additional Comments: Pt works in [employer]- can work from home. Pt reports being very active prior to incidence. pt reprots 4-5 falls "this week"      Prior Functioning/Environment Prior Level of Function : Independent/Modified Independent;Working/employed;Driving;History of Falls (last six months)             Mobility Comments: Ind with RW ADLs Comments: Ind        OT Problem List: Impaired balance (sitting and/or standing);Decreased knowledge of precautions;Decreased activity tolerance      OT Treatment/Interventions: Self-care/ADL training;Therapeutic exercise;Therapeutic activities;Balance training;Patient/family education;DME and/or AE instruction;Energy conservation    OT Goals(Current goals can be found in the care plan section) Acute Rehab OT Goals Patient Stated Goal: To go home OT Goal Formulation: With patient Time For Goal Achievement: 12/16/23 Potential to Achieve Goals: Good ADL Goals Pt Will Perform Grooming: with modified independence;standing Pt Will Perform Lower Body Bathing: with modified independence;sitting/lateral leans Pt Will Perform Lower Body Dressing: with modified independence;sit to/from stand Pt Will Transfer to Toilet: with modified independence;ambulating Pt/caregiver will Perform Home Exercise Program: Increased strength;Both right and left upper extremity;With theraband;With written HEP provided;Independently  OT Frequency: Min 1X/week    Co-evaluation  PT/OT/SLP Co-Evaluation/Treatment: Yes Reason for Co-Treatment: Complexity of the patient's impairments (multi-system involvement);To address functional/ADL transfers PT goals addressed during session: Mobility/safety with mobility;Balance;Proper use of DME OT goals addressed during session: ADL's and self-care      AM-PAC OT "6 Clicks" Daily Activity     Outcome Measure Help from  another person eating meals?: None Help from another person taking care of personal grooming?: A Little Help from another person toileting, which includes using toliet, bedpan, or urinal?: A Lot Help from another person bathing (including washing, rinsing, drying)?: A Little Help from another person to put on and taking off regular upper body clothing?: A Little Help from another person to put on and taking off regular lower body clothing?: A Little (completing seated dressing of shoes, more assist needed for STS with pants) 6 Click Score: 18   End of Session Equipment Utilized During Treatment: Gait belt;Rolling walker (2 wheels) Nurse Communication: Mobility status  Activity Tolerance: Patient tolerated treatment well Patient left: in bed;with call bell/phone within reach;with bed alarm set  OT Visit Diagnosis: Unsteadiness on feet (R26.81);Other abnormalities of gait and mobility (R26.89);History of falling (Z91.81);Repeated falls (R29.6)                Time: 9604-5409 OT Time Calculation (min): 27 min Charges:  OT General Charges $OT Visit: 1 Visit OT Evaluation $OT Eval Moderate Complexity: 1 Mod  12/02/2023  AB, OTR/L  Acute Rehabilitation Services  Office: [phone]   Tristan Schroeder 12/02/2023, 5:56 PM

## 2023-12-02 NOTE — Anesthesia Preprocedure Evaluation (Signed)
Anesthesia Evaluation  Patient identified by MRN, date of birth, ID band Patient awake    Reviewed: Allergy & Precautions, NPO status , Patient's Chart, lab work & pertinent test results  History of Anesthesia Complications Negative for: history of anesthetic complications  Airway Mallampati: II  TM Distance: >3 FB Neck ROM: Full    Dental  (+) Teeth Intact, Dental Advisory Given   Pulmonary neg shortness of breath, neg sleep apnea, neg COPD, neg recent URI   breath sounds clear to auscultation       Cardiovascular hypertension, Pt. on medications (-) angina (-) Past MI and (-) CHF  Rhythm:Regular     Neuro/Psych Seizures -, Well Controlled,  PSYCHIATRIC DISORDERS Anxiety Depression       GI/Hepatic   Endo/Other  diabetes    Renal/GU      Musculoskeletal   Abdominal   Peds  Hematology   Anesthesia Other Findings   Reproductive/Obstetrics                             Anesthesia Physical Anesthesia Plan  ASA: 2  Anesthesia Plan: MAC and Regional   Post-op Pain Management: Regional block*   Induction: Intravenous  PONV Risk Score and Plan: 1 and Propofol infusion and Ondansetron  Airway Management Planned: Nasal Cannula, Natural Airway and Simple Face Mask  Additional Equipment: None  Intra-op Plan:   Post-operative Plan:   Informed Consent: I have reviewed the patients History and Physical, chart, labs and discussed the procedure including the risks, benefits and alternatives for the proposed anesthesia with the patient or authorized representative who has indicated his/her understanding and acceptance.     Dental advisory given  Plan Discussed with: CRNA  Anesthesia Plan Comments:        Anesthesia Quick Evaluation

## 2023-12-02 NOTE — Op Note (Signed)
Orthopaedic Surgery Operative Note (CSN: [id number] ) Date of Surgery: 12/02/2023  Admit Date: 12/02/2023   Diagnoses: Pre-Op Diagnoses: Left trimalleolar ankle fracture   Post-Op Diagnosis: Same  Procedures: CPT 27822-Open reduction internal fixation of left trimalleolar ankle fracture CPT 27829-Open reduction internal fixation of left syndesmosis  Surgeons : Primary: Roby Lofts, MD  Assistant: Ulyses Southward, PA-C  Location: OR 3   Anesthesia: Regional with MAC   Antibiotics: Ancef 2g preop with 1gm vancomycin powder placed topically   Tourniquet time: None    Estimated Blood Loss: 10 mL  Complications:None  Specimens:* No specimens in log *   Implants: Implant Name Type Inv. Item Serial No. Manufacturer Lot No. LRB No. Used Action  PLATE 5H L 81MM FIBULA EVOS - [id number] Plate PLATE 5H L 81MM FIBULA EVOS  [employer]  Left 1 Implanted  SCREW CORT 2.7X20 T8 ST EVOS - [id number] Screw SCREW CORT 2.7X20 T8 ST EVOS  [employer]  Left 1 Implanted  SCREW CORT 2.7X16 STAR T8 EVOS - [id number] Screw SCREW CORT 2.7X16 STAR T8 EVOS  [employer]  Left 4 Implanted  SCREW CORT 3.5X15 ST EVOS - [id number] Screw SCREW CORT 3.5X15 ST EVOS  [employer]  Left 1 Implanted  SCREW CORT EVOS ST 3.5X12 - [id number] Screw SCREW CORT EVOS ST 3.5X12  [employer]  Left 2 Implanted  SCREW CORT ST EVOS 3.5X70 - [id number] Screw SCREW CORT ST EVOS 3.5X70  [employer]  Left 1 Implanted  SCREW CORT ST EVOS 3.5X65 - [id number] Screw SCREW CORT ST EVOS 3.5X65  [employer]  Left 1 Implanted  SCREW CORT ST EVOS 3.5X60 - [id number] Screw SCREW CORT ST EVOS 3.5X60  [employer]  Left 1 Implanted  SCREW CORT ST EVOS 3.5X55 - [id number] Screw SCREW CORT ST EVOS 3.5X55  [employer]  Left 1 Implanted     Indications for Surgery: 67 year old male who  sustained a left trimalleolar ankle fracture dislocation.  He was reduced in the emergency room.  Due to the unstable nature of his injury I recommend proceeding with open reduction and internal fixation of his left ankle.  Risks and benefits were discussed with the patient and his brother.  Risks included but not limited to bleeding, infection, malunion, nonunion, hardware failure, hardware irritation, nerve and blood vessel injury, ankle stiffness and ankle arthritis, DVT, even the possibility anesthetic complications.  The patient agreed to proceed with surgery and consent was obtained.  Operative Findings: 1.  Open reduction internal fixation of left trimalleolar ankle fracture using [employer] EVOS 3.5/2.7 mm distal fibular locking plate and independent 3.5 millimeter screws for the medial malleolus. 2.  External rotation stress view with the medial joint space widening after fixation of the fibula and medial malleolus treated with independent 3.5 mm syndesmotic fixation across both the fibula and tibia.  Procedure: The patient was identified in the preoperative holding area. Consent was confirmed with the patient and their family and all questions were answered. The operative extremity was marked after confirmation with the patient. he was then brought back to the operating room by our anesthesia colleagues.  He was carefully transferred over to radiolucent flattop table.  He was placed under sedation.  The left lower extremity was then prepped and draped in usual sterile fashion.  A timeout was performed to verify the patient, the procedure, and the extremity.  Preoperative  antibiotics were dosed.  Fluoroscopic imaging showed the unstable nature of his injury.  A lateral approach to the distal fibula was carried down through skin and subcutaneous tissue.  I took care to protect the superficial peroneal nerve from any damage.  I then exposed the fracture site cleaned out the hematoma and  anatomically reduced the fracture.  I then placed a [employer] EVOS 3.5/2.7 mm distal fibular locking plate and positioned this appropriately and drilled and placed nonlocking screws above and below the fracture.  I then proceeded to place 2.7 mm locking screws distally.  I then placed 2 more nonlocking screws proximally in the fibular shaft.  I then made a medial incision carried it down through skin and subcutaneous tissue.  Took care to protect the saphenous neurovascular bundle.  I then exposed the fracture used a reduction tenaculum to anatomically reduce the fracture and then drilled and placed bicortical 3.5 mm nonlocking screws to hold the medial malleolus in place.  Once I had fixation of the medial lateral side I then performed an external rotation stress view which showed some mild medial clear space widening.  At this point I felt that syndesmotic fixation would be appropriate.  The ankle was left in neutral a mild medial force was directed on the fibula and I drilled through the fibula and the tibia and measured and placed 3.5 millimeter screw.  This process was repeated proximally.  Excellent fixation was obtained.  Final fluoroscopic imaging was obtained.  The incisions were irrigated and closed with 2-0 Monocryl and 3-0 nylon.  A gram of vancomycin powder was placed in the incisions prior to closure.  Sterile dressing was applied and the patient was placed in a well-padded short leg splint.  The patient was then awoke from anesthesia and taken to the PACU in stable condition.  Post Op Plan/Instructions: Patient will be nonweightbearing to the left lower extremity.  He will receive postoperative Ancef.  He will be placed on aspirin 81 mg for DVT prophylaxis.  Will have him mobilize with physical and Occupational Therapy.  I was present and performed the entire surgery.  Ulyses Southward, PA-C did assist me throughout the case. An assistant was necessary given the difficulty in approach,  maintenance of reduction and ability to instrument the fracture.   Truitt Merle, MD [facility]

## 2023-12-03 DIAGNOSIS — S82852A Displaced trimalleolar fracture of left lower leg, initial encounter for closed fracture: Secondary | ICD-10-CM | POA: Diagnosis not present

## 2023-12-03 LAB — CBC
HCT: 33.1 % — ABNORMAL LOW (ref 39.0–52.0)
Hemoglobin: 11.7 g/dL — ABNORMAL LOW (ref 13.0–17.0)
MCH: 34.9 pg — ABNORMAL HIGH (ref 26.0–34.0)
MCHC: 35.3 g/dL (ref 30.0–36.0)
MCV: 98.8 fL (ref 80.0–100.0)
Platelets: 194 10*3/uL (ref 150–400)
RBC: 3.35 MIL/uL — ABNORMAL LOW (ref 4.22–5.81)
RDW: 12.3 % (ref 11.5–15.5)
WBC: 6.3 10*3/uL (ref 4.0–10.5)
nRBC: 0 % (ref 0.0–0.2)

## 2023-12-03 LAB — BASIC METABOLIC PANEL
Anion gap: 11 (ref 5–15)
BUN: 12 mg/dL (ref 8–23)
CO2: 22 mmol/L (ref 22–32)
Calcium: 8.7 mg/dL — ABNORMAL LOW (ref 8.9–10.3)
Chloride: 106 mmol/L (ref 98–111)
Creatinine, Ser: 0.73 mg/dL (ref 0.61–1.24)
GFR, Estimated: >60 mL/min (ref >60)
Glucose, Bld: 103 mg/dL — ABNORMAL HIGH (ref 70–99)
Potassium: 3.8 mmol/L (ref 3.5–5.1)
Sodium: 139 mmol/L (ref 135–145)

## 2023-12-03 MED ORDER — SODIUM CHLORIDE 0.9% FLUSH
3.0000 mL | Freq: Two times a day (BID) | INTRAVENOUS | Status: DC
  Administered 2023-12-03: 3 mL via INTRAVENOUS

## 2023-12-03 NOTE — Progress Notes (Signed)
Physical Therapy Treatment Patient Details Name: Theodore Gutierrez MRN: [id number] DOB: 11-24-1956 Today's Date: 12/03/2023   History of Present Illness Pt is a 67 y.o. male who presented 12/02/23 for presenting for L ankle ORIF. Patient fell at home on 11/27/2023 and sustained a L trimalleolar ankle fx. PMH: anxiety, depression, DM, HTN, HLD, OCD, seizures.    PT Comments  Notable improvement with mobility today. Supervision with transfers, CGA with gait. Maintains NWB on LLE throughout. Ambulates 90' with RW for support. Extensive education and planning provided during session. Agreeable to use w/c for stair navigation with brother's assistance, can practice with RW with HHPT after d/c. Will use w/c in house, limiting RW use to transfers and very short distances only due to multiple falls. Pt demonstrates good understanding. Discussed fall risk and how to minimize. Eager to go home. Handout provided. HEP reviewed. All questions answered. Will follow until d/c.    If plan is discharge home, recommend the following: A little help with bathing/dressing/bathroom;Assistance with cooking/housework;Assist for transportation;Help with stairs or ramp for entrance;A little help with walking and/or transfers   Can travel by private vehicle        Equipment Recommendations  BSC/3in1;Other (comment) (Light-weight w/c with elevating leg-rests)    Recommendations for Other Services       Precautions / Restrictions Precautions Precautions: Fall Precaution Comments: hx of falls PTA Restrictions Weight Bearing Restrictions: Yes LLE Weight Bearing: Non weight bearing     Mobility  Bed Mobility               General bed mobility comments: in recliner    Transfers Overall transfer level: Needs assistance Equipment used: Rolling walker (2 wheels) Transfers: Sit to/from Stand, Bed to chair/wheelchair/BSC Sit to Stand: Supervision           General transfer comment: Supervision for safety,  performed from recliner. Cues for hand placement. Maintains NWB on LLE throughout.    Ambulation/Gait Ambulation/Gait assistance: Contact guard assist Gait Distance (Feet): 40 Feet Assistive device: Rolling walker (2 wheels) Gait Pattern/deviations:  (hop-to) Gait velocity: reduced Gait velocity interpretation: <1.31 ft/sec, indicative of household ambulator   General Gait Details: After extensive review and education, pt able to ambulate at [facility] with RW 40 feet, hoping on Rt foot with good RW control. Needed early cues to prevent getting too close to front of RW but carried over well remainder of distance. Educated on safety and awareness in hazardous areas of the home, remove throwrugs, and avoid areas where water may be on the floor. No buckling or overt LOB during bout. Maintains NWB on LLE at all times.   Stairs Stairs:  (Extensive education; pt prefers to use w/c with brother's help. Handout provided, reviewed. Will wait for HHPT to train with RW.)           Merchant navy officer mobility:  (Verbally reviewed)   Tilt Bed    Modified Rankin (Stroke Patients Only)       Balance Overall balance assessment: Needs assistance Sitting-balance support: Feet supported, No upper extremity supported Sitting balance-Leahy Scale: Fair Sitting balance - Comments: sitting EOB   Standing balance support: Bilateral upper extremity supported, During functional activity, Reliant on assistive device for balance Standing balance-Leahy Scale: Poor Standing balance comment: Reliant on RW                            Cognition Arousal: Alert Behavior During Therapy: [facility]  for tasks assessed/performed Overall Cognitive Status: Within Functional Limits for tasks assessed                                          Exercises Other Exercises Other Exercises: toe flexion/extension Other Exercises: quad sets x10, glute sets x10. Reviewed  SLR and hip abduction; encouraged to perform several times per day until HHPT advances.    General Comments General comments (skin integrity, edema, etc.): LLE elevated, ice applied. All questions answered.      Pertinent Vitals/Pain Pain Assessment Pain Assessment: Faces Faces Pain Scale: Hurts little more Pain Location: LLE Pain Descriptors / Indicators: Aching Pain Intervention(s): Monitored during session    Home Living                          Prior Function            PT Goals (current goals can now be found in the care plan section) Acute Rehab PT Goals Patient Stated Goal: to go home, return to work soon. PT Goal Formulation: With patient Time For Goal Achievement: 12/16/23 Potential to Achieve Goals: Good Progress towards PT goals: Progressing toward goals    Frequency    Min 1X/week      PT Plan      Co-evaluation              AM-PAC PT "6 Clicks" Mobility   Outcome Measure  Help needed turning from your back to your side while in a flat bed without using bedrails?: A Little Help needed moving from lying on your back to sitting on the side of a flat bed without using bedrails?: A Little Help needed moving to and from a bed to a chair (including a wheelchair)?: A Little Help needed standing up from a chair using your arms (e.g., wheelchair or bedside chair)?: A Little Help needed to walk in hospital room?: A Little Help needed climbing 3-5 steps with a railing? : Total 6 Click Score: 16    End of Session Equipment Utilized During Treatment: Gait belt Activity Tolerance: Patient tolerated treatment well Patient left: with call bell/phone within reach;in chair   PT Visit Diagnosis: Unsteadiness on feet (R26.81);Other abnormalities of gait and mobility (R26.89);Muscle weakness (generalized) (M62.81);History of falling (Z91.81);Difficulty in walking, not elsewhere classified (R26.2);Repeated falls (R29.6);Pain Pain - Right/Left:  Left Pain - part of body: Ankle and joints of foot     Time: 3086-5784 PT Time Calculation (min) (ACUTE ONLY): 25 min  Charges:    $Gait Training: 8-22 mins $Therapeutic Activity: 8-22 mins PT General Charges $$ ACUTE PT VISIT: 1 Visit                     Kathlyn [place], PT, DPT [facility]  Rehabilitation Services Physical Therapist Office: [phone] Website: [email or web address]    Berton Mount 12/03/2023, 10:59 AM

## 2023-12-03 NOTE — Care Management Obs Status (Signed)
MEDICARE OBSERVATION STATUS NOTIFICATION   Patient Details  Name: Theodore Gutierrez MRN: [id number] Date of Birth: [birth year]   Medicare Observation Status Notification Given:  Yes    Lawerance Sabal, RN 12/03/2023, 9:58 AM

## 2023-12-03 NOTE — Plan of Care (Signed)

## 2023-12-03 NOTE — Progress Notes (Signed)
Occupational Therapy Treatment Patient Details Name: Theodore Gutierrez MRN: [id number] DOB: 1956-04-16 Today's Date: 12/03/2023   History of present illness Pt is a 67 y.o. male who presented 12/02/23 for presenting for L ankle ORIF. Patient fell at home on 11/27/2023 and sustained a L trimalleolar ankle fx. PMH: anxiety, depression, DM, HTN, HLD, OCD, seizures   OT comments  Pt. Seen for skilled OT treatment session. Pt. Motivated and agreeable to participation.  Bed mobility MOD I.  LB dressing R shoe set up seated.  Ambulation to/from b.room for toileting task/education min. A.  Good adherence with NWB but required max cues for rw management and safe transfers.  Reports his brother is available for 24/7 assistance prn.  If going home needs 24/7 available A for safety during mobility and ADLS.        If plan is discharge home, recommend the following:  A little help with bathing/dressing/bathroom;A little help with walking and/or transfers;Assistance with cooking/housework;Assist for transportation;Help with stairs or ramp for entrance   Equipment Recommendations  BSC/3in1;Tub/shower seat    Recommendations for Other Services      Precautions / Restrictions Precautions Precautions: Fall Precaution Comments: hx of falls PTA Restrictions LLE Weight Bearing: Non weight bearing       Mobility Bed Mobility Overal bed mobility: Modified Independent Bed Mobility: Supine to Sit           General bed mobility comments: no rails, hob flat exit on R side to simulate home env.    Transfers Overall transfer level: Needs assistance Equipment used: Rolling walker (2 wheels) Transfers: Sit to/from Stand, Bed to chair/wheelchair/BSC Sit to Stand: Min assist     Step pivot transfers: Min assist     General transfer comment: good adherence to nwb but required max education/cues not to sit before reaching surface.  reviewed at length square up, feel legs touching chair, reaching back for  arm rests     Balance                                           ADL either performed or assessed with clinical judgement   ADL Overall ADL's : Needs assistance/impaired                     Lower Body Dressing: Set up;Sitting/lateral leans Lower Body Dressing Details (indicate cue type and reason): able to don R shoe in sitting with no difficulty Toilet Transfer: Minimal assistance;Rolling walker (2 wheels);Ambulation;Regular Teacher, adult education Details (indicate cue type and reason): stood in b.room in front of toilet, did not need to actually use. extensive eduation on how to manage rw in standing and nwb. reviewed can not have on hand on the walker and not the other hand as severe fall risk with rw tipping to opposite side. states brother will stand with him and hold oppposite side of rw. also reviewed sitting for all toileting and use of urinal at night for safety         Functional mobility during ADLs: Minimal assistance;Rolling walker (2 wheels) General ADL Comments: education for rw managment and safety with hand placement on rw during ambulation and transfers    Extremity/Trunk Assessment              Vision       Perception     Praxis      Cognition  Arousal: Alert Behavior During Therapy: WFL for tasks assessed/performed Overall Cognitive Status: Within Functional Limits for tasks assessed                                          Exercises      Shoulder Instructions       General Comments      Pertinent Vitals/ Pain       Pain Assessment Pain Assessment: Faces Faces Pain Scale: Hurts little more Pain Location: LLE Pain Descriptors / Indicators: Aching Pain Intervention(s): Monitored during session, Repositioned  Home Living                                          Prior Functioning/Environment              Frequency  Min 1X/week        Progress Toward Goals  OT  Goals(current goals can now be found in the care plan section)  Progress towards OT goals: Progressing toward goals     Plan      Co-evaluation                 AM-PAC OT "6 Clicks" Daily Activity     Outcome Measure   Help from another person eating meals?: None Help from another person taking care of personal grooming?: A Little Help from another person toileting, which includes using toliet, bedpan, or urinal?: A Lot Help from another person bathing (including washing, rinsing, drying)?: A Little Help from another person to put on and taking off regular upper body clothing?: A Little Help from another person to put on and taking off regular lower body clothing?: A Little 6 Click Score: 18    End of Session Equipment Utilized During Treatment: Gait belt;Rolling walker (2 wheels)  OT Visit Diagnosis: Unsteadiness on feet (R26.81);Other abnormalities of gait and mobility (R26.89);History of falling (Z91.81);Repeated falls (R29.6)   Activity Tolerance Patient tolerated treatment well   Patient Left in chair;with call bell/phone within reach   Nurse Communication Other (comment) (rn states ok to work with pt.,secure chat at end of session pt. up in chair eating b.fast no chair alarm. pt. aware not to get up without assistance)        Time: 1324-4010 OT Time Calculation (min): 25 min  Charges: OT General Charges $OT Visit: 1 Visit OT Treatments $Self Care/Home Management : 23-37 mins  Boneta Lucks, COTA/L Acute Rehabilitation [phone]   Alessandra Bevels Lorraine-COTA/L 12/03/2023, 9:08 AM

## 2023-12-03 NOTE — TOC Transition Note (Signed)
Transition of Care [facility]) - CM/SW Discharge Note   Patient Details  Name: Theodore Gutierrez MRN: [id number] Date of Birth: [birth year]  [facility] of Care [facility]) CM/SW Contact:  Lawerance Sabal, RN Phone Number: 12/03/2023, 10:45 AM   Clinical Narrative:     Discussed DC plan w patient. He will have support from brother after DC. Brother will also provide ride home.  Patient states that he has a RW at home. We discussed DME needs. He was concerned re price. He states that he will continue to use the lawn chair as shower seat, declined 3/1. I obtained estimate for WC at $20-30 a month through [facility] and he was agreeable to be sent home one with one. Order placed. WC will be delivered to the room.  Patient agreeable to [facility] services. Referral accepted by [facility]    Final next level of care: Home/Self Care Barriers to Discharge: No Barriers Identified   Patient Goals and CMS Choice      Discharge Placement                         Discharge Plan and Services Additional resources added to the After Visit Summary for                  DME Arranged: Lightweight manual wheelchair with seat cushion DME Agency: [employer] Date DME Agency Contacted: 12/03/23 Time DME Agency Contacted: 1045 Representative spoke with at DME Agency: Vaughan Basta            Social Determinants of Health (SDOH) Interventions SDOH Screenings   Food Insecurity: No Food Insecurity (12/02/2023)  Housing: Low Risk  (12/02/2023)  Transportation Needs: No Transportation Needs (12/02/2023)  Utilities: Not At Risk (12/02/2023)  Financial Resource Strain: Low Risk  (08/24/2022)   Received from [facility], [facility] visits prior to 02/26/2023.  Physical Activity: Sufficiently Active (08/24/2022)   Received from [facility], [facility] visits prior to 02/26/2023.  Social Connections: Moderately Integrated (08/24/2022)   Received from [facility], [facility] visits prior to 02/26/2023.  Stress: Stress Concern Present (08/24/2022)   Received from [facility], [facility] visits prior to 02/26/2023.  Tobacco Use: Low Risk  (12/02/2023)     Readmission Risk Interventions     No data to display

## 2023-12-03 NOTE — Progress Notes (Signed)
Inpatient Rehab Admissions Coordinator:   Per therapy recommendations, patient was screened for CIR candidacy by Bethann Qualley, MS, CCC-SLP. At this time, Pt. does not appear to demonstrate medical necessity to justify in hospital rehabilitation/CIR. will not pursue a rehab consult for this Pt.   Recommend other rehab venues to be pursued.  Please contact me with any questions.  Kerrilyn Azbill, MS, CCC-SLP Rehab Admissions Coordinator  [phone] (celll) [phone] (office)   

## 2023-12-03 NOTE — Discharge Summary (Signed)
Physician Discharge Summary  Patient ID: Theodore Gutierrez MRN: [id number] DOB/AGE: 1956/09/28 67 y.o.  Admit date: 12/02/2023 Discharge date: 12/03/2023  Admission Diagnoses:  Closed displaced trimalleolar fracture of right ankle  Discharge Diagnoses:  Principal Problem:   Closed displaced trimalleolar fracture of right ankle Active Problems:   Closed displaced trimalleolar fracture of left ankle   Past Medical History:  Diagnosis Date   Anxiety    Depression    Diabetes mellitus without complication (HCC)    no longer on medication   Hyperlipidemia    Hypertension    OCD (obsessive compulsive disorder)    per patient   Seizures (HCC)     Surgeries: Procedure(s): OPEN REDUCTION INTERNAL FIXATION (ORIF) ANKLE FRACTURE on 12/02/2023   Consultants (if any):   Discharged Condition: Improved  Hospital Course: ORLAND CARIKER is an 67 y.o. male who was admitted 12/02/2023 with a diagnosis of Closed displaced trimalleolar fracture of right ankle and went to the operating room on 12/02/2023 and underwent the above named procedures.    He was given perioperative antibiotics:  Anti-infectives (From admission, onward)    Start     Dose/Rate Route Frequency Ordered Stop   12/03/23 0700  valACYclovir (VALTREX) tablet 1,000 mg        1,000 mg Oral Every morning 12/02/23 1515     12/02/23 1615  ceFAZolin (ANCEF) IVPB 2g/100 mL premix        2 g 200 mL/hr over 30 Minutes Intravenous Every 8 hours 12/02/23 1515 12/03/23 0638   12/02/23 0808  vancomycin (VANCOCIN) powder  Status:  Discontinued          As needed 12/02/23 0809 12/02/23 0856   12/02/23 0645  ceFAZolin (ANCEF) IVPB 2g/100 mL premix        2 g 200 mL/hr over 30 Minutes Intravenous On call to O.R. 12/02/23 3810 12/02/23 1751     .  He was given sequential compression devices, early ambulation, and aspirin for DVT prophylaxis.  He benefited maximally from the hospital stay and there were no complications.    Recent  vital signs:  Vitals:   12/03/23 0133 12/03/23 0608  BP: 125/87 (!) 149/83  Pulse: 72 69  Resp: 18 18  Temp: 98 F (36.7 C) 97.7 F (36.5 C)  SpO2: 95% 97%    Recent laboratory studies:  Lab Results  Component Value Date   HGB 11.7 (L) 12/03/2023   HGB 11.7 (L) 12/02/2023   HGB 12.3 (L) 12/02/2023   Lab Results  Component Value Date   WBC 6.3 12/03/2023   PLT 194 12/03/2023   No results found for: "INR" Lab Results  Component Value Date   NA 139 12/03/2023   K 3.8 12/03/2023   CL 106 12/03/2023   CO2 22 12/03/2023   BUN 12 12/03/2023   CREATININE 0.73 12/03/2023   GLUCOSE 103 (H) 12/03/2023    Discharge Medications:   Allergies as of 12/03/2023   No Known Allergies      Medication List     TAKE these medications    aspirin EC 81 MG tablet Take 81 mg by mouth in the morning.   clonazePAM 1 MG tablet Commonly known as: KLONOPIN Take 1 mg by mouth 3 (three) times daily as needed for anxiety.   fenofibrate 160 MG tablet Take 160 mg by mouth in the morning.   ibuprofen 800 MG tablet Commonly known as: ADVIL Take 800 mg by mouth in the morning.   irbesartan 300  MG tablet Commonly known as: AVAPRO Take 300 mg by mouth every evening.   lamoTRIgine 100 MG tablet Commonly known as: LAMICTAL Take 100 mg by mouth in the morning.   lidocaine 5 % ointment Commonly known as: XYLOCAINE Apply 1 Application topically daily as needed (pain.).   methocarbamol 500 MG tablet Commonly known as: ROBAXIN Take 1 tablet (500 mg total) by mouth every 6 (six) hours as needed for muscle spasms.   metoprolol tartrate 25 MG tablet Commonly known as: LOPRESSOR Take 25 mg by mouth 2 (two) times daily.   oxyCODONE-acetaminophen 5-325 MG tablet Commonly known as: PERCOCET/ROXICET Take 1 tablet by mouth every 6 (six) hours as needed for severe pain (pain score 7-10).   PA ECHINACEA PO Take 1 capsule by mouth daily as needed (immune support).   PARoxetine 10 MG  tablet Commonly known as: PAXIL Take 10 mg by mouth in the morning. 10 mg + 40 mg= 50 mg   PARoxetine 40 MG tablet Commonly known as: PAXIL Take 40 mg by mouth in the morning. 40 mg + 10 mg= 50 mg   phenytoin 100 MG ER capsule Commonly known as: DILANTIN Take 2-3 capsules (200-300 mg total) by mouth 2 (two) times daily. 300 mg in the am and 200 mg mid day What changed:  when to take this additional instructions   Probiotics + Bariatric Multi Caps Take 2 tablets by mouth daily.   rosuvastatin 40 MG tablet Commonly known as: CRESTOR Take 40 mg by mouth every evening.   tadalafil 20 MG tablet Commonly known as: CIALIS Take 20 mg by mouth daily as needed for erectile dysfunction.   valACYclovir 1000 MG tablet Commonly known as: VALTREX Take 1,000 mg by mouth in the morning.   Vitamin D3 10 MCG (400 UNIT) tablet Take 800 Units by mouth daily with lunch.   ZINC PO Take 1 tablet by mouth daily as needed (immune support).        Diagnostic Studies: DG Ankle Left Port  Result Date: 12/02/2023 CLINICAL DATA:  67 year old male undergoing ORIF left ankle fracture dislocation. EXAM: PORTABLE LEFT ANKLE - 2 VIEW COMPARISON:  Intraoperative images today.  11/28/2023. FINDINGS: Three views in cast material. Trimalleolar fracture with distal tibia and distal fibula ORIF. Near anatomic alignment. No adverse features. No new osseous abnormality identified. IMPRESSION: Status post ORIF left ankle trimalleolar fracture. Near anatomic alignment, no adverse features. Electronically Signed   By: Odessa Fleming M.D.   On: 12/02/2023 11:31   DG Ankle Complete Left  Result Date: 12/02/2023 CLINICAL DATA:  67 year old male undergoing ORIF left ankle fracture dislocation. EXAM: LEFT ANKLE COMPLETE - 3+ VIEW COMPARISON:  11/28/2023. FINDINGS: Five intraoperative fluoroscopic spot views. Distal fibula lateral plate and screws are placed. Medial malleolus cortical screws are placed. And on the final images  there are screws traversing the distal tibia fibular syndesmosis. No adverse hardware features identified. Near anatomic alignment. Posterior malleolus fracture also redemonstrated. IMPRESSION: Intraoperative images of trimalleolar fracture ORIF. No adverse features. Electronically Signed   By: Odessa Fleming M.D.   On: 12/02/2023 11:26   DG C-Arm 1-60 Min-No Report  Result Date: 12/02/2023 Fluoroscopy was utilized by the requesting physician.  No radiographic interpretation.   DG Ankle Complete Left  Result Date: 11/28/2023 CLINICAL DATA:  Status post fall, post reduction images. EXAM: LEFT ANKLE COMPLETE - 3+ VIEW COMPARISON:  November 28, 2023 (12:21 a.m.) FINDINGS: The left ankle was imaged in a fiberglass cast with subsequently obscured osseous and  soft tissue detail. Acute tri malleolar fracture of the left ankle is again seen with gross anatomic alignment of the fracture sites. Interval reduction of the previously noted left ankle dislocation is noted. There is no evidence of arthropathy or other focal bone abnormality. Mild diffuse soft tissue swelling is seen. IMPRESSION: Acute tri malleolar fracture of the left ankle with gross anatomic alignment of the fracture sites. Electronically Signed   By: Aram Candela M.D.   On: 11/28/2023 02:18   DG Ankle Complete Left  Result Date: 11/28/2023 CLINICAL DATA:  Patient rolled the left ankle during a fall. EXAM: LEFT ANKLE COMPLETE - 3+ VIEW COMPARISON:  None Available. FINDINGS: Transverse comminuted fracture of the medial malleolus with about 1.6 cm lateral displacement of the distal fracture fragment as well as the talus with respect to the tibia. Oblique fracture of the distal fibula with full shaft width posterolateral displacement and posterior overriding of the distal fracture fragment. Probable displaced coronal fracture of the posterior malleolus. Posterior displacement of the talus with respect to the tibia. IMPRESSION: Comminuted and displaced  trimalleolar fractures of the left ankle. Electronically Signed   By: Burman Nieves M.D.   On: 11/28/2023 01:06    Disposition:      Follow-up Information     Haddix, Gillie Manners, MD. Schedule an appointment as soon as possible for a visit in 2 week(s).   Specialty: Orthopedic Surgery Why: for wound check and repeat x-rays Contact information: [address] [place] [address] [phone]                  Signed: Eulas Post 12/03/2023, 7:44 AM

## 2023-12-03 NOTE — Care Management CC44 (Signed)
Condition Code 44 Documentation Completed  Patient Details  Name: ORVEN ISRAELSON MRN: [id number] Date of Birth: 1956-04-05   Condition Code 44 given:  Yes Patient signature on Condition Code 44 notice:  Yes Documentation of 2 MD's agreement:  Yes Code 44 added to claim:  Yes    Lawerance Sabal, RN 12/03/2023, 9:58 AM

## 2023-12-09 ENCOUNTER — Encounter (HOSPITAL_COMMUNITY): Payer: Self-pay | Admitting: Student

## 2023-12-11 ENCOUNTER — Encounter (HOSPITAL_COMMUNITY): Payer: Self-pay | Admitting: Student

## 2024-03-20 ENCOUNTER — Encounter (HOSPITAL_COMMUNITY): Payer: Self-pay

## 2024-03-20 ENCOUNTER — Other Ambulatory Visit: Payer: Self-pay

## 2024-03-20 ENCOUNTER — Observation Stay (HOSPITAL_COMMUNITY)
Admission: EM | Admit: 2024-03-20 | Discharge: 2024-03-22 | Disposition: A | Discharge status: Home / Self Care | Attending: Internal Medicine | Admitting: Internal Medicine

## 2024-03-20 DIAGNOSIS — E119 Type 2 diabetes mellitus without complications: Secondary | ICD-10-CM | POA: Insufficient documentation

## 2024-03-20 DIAGNOSIS — Z79899 Other long term (current) drug therapy: Secondary | ICD-10-CM | POA: Diagnosis not present

## 2024-03-20 DIAGNOSIS — G40909 Epilepsy, unspecified, not intractable, without status epilepticus: Secondary | ICD-10-CM | POA: Diagnosis not present

## 2024-03-20 DIAGNOSIS — G8918 Other acute postprocedural pain: Secondary | ICD-10-CM | POA: Insufficient documentation

## 2024-03-20 DIAGNOSIS — Z7982 Long term (current) use of aspirin: Secondary | ICD-10-CM | POA: Diagnosis not present

## 2024-03-20 DIAGNOSIS — E785 Hyperlipidemia, unspecified: Secondary | ICD-10-CM | POA: Diagnosis not present

## 2024-03-20 DIAGNOSIS — I10 Essential (primary) hypertension: Secondary | ICD-10-CM | POA: Diagnosis present

## 2024-03-20 DIAGNOSIS — F411 Generalized anxiety disorder: Secondary | ICD-10-CM | POA: Diagnosis present

## 2024-03-20 DIAGNOSIS — F419 Anxiety disorder, unspecified: Secondary | ICD-10-CM | POA: Insufficient documentation

## 2024-03-20 DIAGNOSIS — R569 Unspecified convulsions: Secondary | ICD-10-CM | POA: Diagnosis present

## 2024-03-20 DIAGNOSIS — E876 Hypokalemia: Secondary | ICD-10-CM | POA: Insufficient documentation

## 2024-03-20 DIAGNOSIS — S82852A Displaced trimalleolar fracture of left lower leg, initial encounter for closed fracture: Secondary | ICD-10-CM | POA: Diagnosis not present

## 2024-03-20 DIAGNOSIS — M25572 Pain in left ankle and joints of left foot: Secondary | ICD-10-CM | POA: Insufficient documentation

## 2024-03-20 DIAGNOSIS — W19XXXA Unspecified fall, initial encounter: Secondary | ICD-10-CM | POA: Diagnosis not present

## 2024-03-20 DIAGNOSIS — G40919 Epilepsy, unspecified, intractable, without status epilepticus: Secondary | ICD-10-CM | POA: Diagnosis present

## 2024-03-20 LAB — CBC WITH DIFFERENTIAL/PLATELET
Abs Immature Granulocytes: 0.03 10*3/uL (ref 0.00–0.07)
Basophils Absolute: 0 10*3/uL (ref 0.0–0.1)
Basophils Relative: 0 %
Eosinophils Absolute: 0.1 10*3/uL (ref 0.0–0.5)
Eosinophils Relative: 1 %
HCT: 42 % (ref 39.0–52.0)
Hemoglobin: 15.1 g/dL (ref 13.0–17.0)
Immature Granulocytes: 0 %
Lymphocytes Relative: 11 %
Lymphs Abs: 0.8 10*3/uL (ref 0.7–4.0)
MCH: 35.5 pg — ABNORMAL HIGH (ref 26.0–34.0)
MCHC: 36 g/dL (ref 30.0–36.0)
MCV: 98.8 fL (ref 80.0–100.0)
Monocytes Absolute: 0.5 10*3/uL (ref 0.1–1.0)
Monocytes Relative: 7 %
Neutro Abs: 6 10*3/uL (ref 1.7–7.7)
Neutrophils Relative %: 81 %
Platelets: 201 10*3/uL (ref 150–400)
RBC: 4.25 MIL/uL (ref 4.22–5.81)
RDW: 12.5 % (ref 11.5–15.5)
WBC: 7.4 10*3/uL (ref 4.0–10.5)
nRBC: 0 % (ref 0.0–0.2)

## 2024-03-20 LAB — COMPREHENSIVE METABOLIC PANEL
ALT: 19 U/L (ref 0–44)
AST: 25 U/L (ref 15–41)
Albumin: 3.9 g/dL (ref 3.5–5.0)
Alkaline Phosphatase: 62 U/L (ref 38–126)
Anion gap: 10 (ref 5–15)
BUN: 20 mg/dL (ref 8–23)
CO2: 25 mmol/L (ref 22–32)
Calcium: 9.7 mg/dL (ref 8.9–10.3)
Chloride: 105 mmol/L (ref 98–111)
Creatinine, Ser: 0.9 mg/dL (ref 0.61–1.24)
GFR, Estimated: >60 mL/min (ref >60)
Glucose, Bld: 105 mg/dL — ABNORMAL HIGH (ref 70–99)
Potassium: 3.5 mmol/L (ref 3.5–5.1)
Sodium: 140 mmol/L (ref 135–145)
Total Bilirubin: 0.6 mg/dL (ref 0.0–1.2)
Total Protein: 6.9 g/dL (ref 6.5–8.1)

## 2024-03-20 LAB — URINALYSIS, ROUTINE W REFLEX MICROSCOPIC
Bilirubin Urine: NEGATIVE
Glucose, UA: NEGATIVE mg/dL
Hgb urine dipstick: NEGATIVE
Ketones, ur: NEGATIVE mg/dL
Leukocytes,Ua: NEGATIVE
Nitrite: NEGATIVE
Protein, ur: NEGATIVE mg/dL
Specific Gravity, Urine: 1.013 (ref 1.005–1.030)
pH: 6 (ref 5.0–8.0)

## 2024-03-20 LAB — RAPID URINE DRUG SCREEN, HOSP PERFORMED
Amphetamines: NOT DETECTED
Barbiturates: NOT DETECTED
Benzodiazepines: NOT DETECTED
Cocaine: NOT DETECTED
Opiates: NOT DETECTED
Tetrahydrocannabinol: NOT DETECTED

## 2024-03-20 LAB — PHENYTOIN LEVEL, TOTAL: Phenytoin Lvl: 13.8 ug/mL (ref 10.0–20.0)

## 2024-03-20 LAB — MAGNESIUM: Magnesium: 2 mg/dL (ref 1.7–2.4)

## 2024-03-20 MED ORDER — LORAZEPAM 2 MG/ML IJ SOLN
INTRAMUSCULAR | Status: AC
Start: 2024-03-20 — End: 2024-03-21
  Filled 2024-03-20: qty 1

## 2024-03-20 MED ORDER — ENOXAPARIN SODIUM 40 MG/0.4ML IJ SOSY
40.0000 mg | PREFILLED_SYRINGE | Freq: Every day | INTRAMUSCULAR | Status: DC
  Administered 2024-03-21 – 2024-03-22 (×2): 40 mg via SUBCUTANEOUS
  Filled 2024-03-20 (×2): qty 0.4

## 2024-03-20 MED ORDER — ACETAMINOPHEN 500 MG PO TABS
1000.0000 mg | ORAL_TABLET | Freq: Once | ORAL | Status: AC
  Administered 2024-03-20: 1000 mg via ORAL
  Filled 2024-03-20: qty 2

## 2024-03-20 MED ORDER — LORAZEPAM 2 MG/ML IJ SOLN: 2.0000 mg | Freq: Four times a day (QID) | INTRAMUSCULAR | Status: DC | PRN

## 2024-03-20 MED ORDER — PHENYTOIN SODIUM EXTENDED 100 MG PO CAPS
300.0000 mg | ORAL_CAPSULE | Freq: Once | ORAL | Status: AC
  Administered 2024-03-20: 300 mg via ORAL
  Filled 2024-03-20: qty 3

## 2024-03-20 MED ORDER — SODIUM CHLORIDE 0.9 % IV SOLN
3000.0000 mg | Freq: Once | INTRAVENOUS | Status: AC
  Administered 2024-03-20: 3000 mg via INTRAVENOUS
  Filled 2024-03-20: qty 30

## 2024-03-20 NOTE — ED Triage Notes (Signed)
 Pt BIB [facility] from home d/t witnessed seizure that his brother stated pt fell down & had a 1-2 min grand-mal seizure. Upon EMS arrival he was postictal & continuously stating "No, No, No." Pt reports his last seizure was in 2017 & he is thinking the Hydrocodone or Klonopin he took may have reacted with his Dilantin, has not had his levels checked in a "long time" (per pt). A/Ox4 upon arrival & does have a red area on Rt shoulder & Rt chest area from the fall/seizure, denies any head pain. 158/92, 98 bpm, 99% on RA, CBG 126, 20g Rt AC PIV.

## 2024-03-20 NOTE — ED Provider Notes (Signed)
 [place] EMERGENCY DEPARTMENT AT [facility] Provider Note   CSN: [id number] Arrival date & time: 03/20/24  1605     History  Chief Complaint  Patient presents with   Seizures    Theodore Gutierrez is a 68 y.o. male with PMH as listed below who presents BIB [facility] from home d/t witnessed seizure that his brother stated pt fell down & had a 1-2 min GTC seizure. Upon EMS arrival he was postictal & continuously stating "No, No, No." In December 2024 patient had ORIF for left trimal fx and is still in a boot, had recent appt with continued post-op pain.  He does note that his orthopedic surgeon Dr. Jena Gauss had warned him about taking his klonopin with hydrocodone, so he had been trying to take less klonopin. He does normally take 2-3 doses of 1 mg klonopin per day, and had decreased it to just 1-2 doses, then hadn't taken any since Saturday until he took one dose today because he felt very jittery/anxious.  Pt reports his last seizure was in 2017 & he is thinking the Hydrocodone or Klonopin he took may have reacted with his Dilantin, has not had his levels checked in a "long time."  On arrival to ED he is pleasantly A&Ox4, accompanied by his brother who lives with him and witnessed the seizure. He did bite his tongue during the seizure but did not lose bladder. The seizure occurred in an easy chair and patient did not fall or hit his head. He complains of no pain anywhere except his left ankle which is in a boot. He takes his dilantin/lamictal faithfully and hasn't missed any doses. Incidentally, he also notes that his left ear feels full and muffled and requests that it be checked.   Past Medical History:  Diagnosis Date   Anxiety    Depression    Diabetes mellitus without complication (HCC)    no longer on medication   Hyperlipidemia    Hypertension    OCD (obsessive compulsive disorder)    per patient   Seizures (HCC)        Home Medications Prior to Admission medications    Medication Sig Start Date End Date Taking? Authorizing Provider  aspirin EC 81 MG tablet Take 81 mg by mouth in the morning.    [provider]  Cholecalciferol (VITAMIN D3) 10 MCG (400 UNIT) tablet Take 800 Units by mouth daily with lunch.    [provider]  clonazePAM (KLONOPIN) 1 MG tablet Take 1 mg by mouth 3 (three) times daily as needed for anxiety. 01/25/16   [provider]  fenofibrate 160 MG tablet Take 160 mg by mouth in the morning. 02/15/16   [provider]  ibuprofen (ADVIL) 800 MG tablet Take 800 mg by mouth in the morning.    [provider]  irbesartan (AVAPRO) 300 MG tablet Take 300 mg by mouth every evening. 01/02/16   [provider]  lamoTRIgine (LAMICTAL) 100 MG tablet Take 100 mg by mouth in the morning.    [provider]  lidocaine (XYLOCAINE) 5 % ointment Apply 1 Application topically daily as needed (pain.).    [provider]  methocarbamol (ROBAXIN) 500 MG tablet Take 1 tablet (500 mg total) by mouth every 6 (six) hours as needed for muscle spasms. 12/02/23   West Bali, PA-C  metoprolol tartrate (LOPRESSOR) 25 MG tablet Take 25 mg by mouth 2 (two) times daily.    [provider]  Multiple Vitamins-Minerals (PROBIOTICS + BARIATRIC MULTI) CAPS Take 2 tablets by mouth daily.    [provider]  Multiple Vitamins-Minerals (ZINC PO) Take 1 tablet by mouth daily as needed (immune support).    [provider]  oxyCODONE-acetaminophen (PERCOCET/ROXICET) 5-325 MG tablet Take 1 tablet by mouth every 6 (six) hours as needed for severe pain (pain score 7-10). 12/02/23   West Bali, PA-C  PA ECHINACEA PO Take 1 capsule by mouth daily as needed (immune support).    [provider]  PARoxetine (PAXIL) 10 MG tablet Take 10 mg by mouth in the morning. 10 mg + 40 mg= 50 mg    [provider]  PARoxetine (PAXIL) 40 MG tablet Take 40 mg by mouth in the morning. 40  mg + 10 mg= 50 mg    [provider]  phenytoin (DILANTIN) 100 MG ER capsule Take 2-3 capsules (200-300 mg total) by mouth 2 (two) times daily. 300 mg in the am and 200 mg mid day Patient taking differently: Take 200-300 mg by mouth See admin instructions. Take 2 capsules (200 mg) by mouth in the morning & take 3 capsules (300 mg) by mouth at night. 02/22/16   Joseph Art, DO  rosuvastatin (CRESTOR) 40 MG tablet Take 40 mg by mouth every evening.    [provider]  tadalafil (CIALIS) 20 MG tablet Take 20 mg by mouth daily as needed for erectile dysfunction.    [provider]  valACYclovir (VALTREX) 1000 MG tablet Take 1,000 mg by mouth in the morning. 01/20/23   [provider]      Allergies    Oxycodone    Review of Systems   Review of Systems A 10 point review of systems was performed and is negative unless otherwise reported in HPI.  Physical Exam Updated Vital Signs BP (!) 126/92   Pulse 75   Temp 98.1 F (36.7 C) (Oral)   Resp 14   Ht 6' (1.829 m)   Wt 80.3 kg   SpO2 98%   BMI 24.01 kg/m  Physical Exam General: Normal appearing male, lying in bed.  HEENT: NCAT. PERRLA, EOMI, no nystagmus, Sclera anicteric, MMM, trachea midline. Clear right TM, normal cone of light. L TM w/ wax impaction.  Cardiology: RRR, no murmurs/rubs/gallops.  Resp: Normal respiratory rate and effort. CTAB, no wheezes, rhonchi, crackles.  Abd: Soft, non-tender, non-distended. No rebound tenderness or guarding.  GU: Deferred. MSK: L lower leg in CAM boot. No peripheral edema or signs of trauma. Extremities without deformity or TTP. No cyanosis or clubbing. Skin: warm, dry. No rashes or lesions. Back: No CVA tenderness. No midline C spine TTP.  Neuro: A&Ox4, CNs II-XII grossly intact. MAEs. Sensation grossly intact.  Psych: Normal mood and affect.   ED Results / Procedures / Treatments   Labs (all labs ordered are listed, but only abnormal results are  displayed) Labs Reviewed  COMPREHENSIVE METABOLIC PANEL - Abnormal; Notable for the following components:      Result Value   Glucose, Bld 105 (*)    All other components within normal limits  CBC WITH DIFFERENTIAL/PLATELET - Abnormal; Notable for the following components:   MCH 35.5 (*)    All other components within normal limits  MAGNESIUM  URINALYSIS, ROUTINE W REFLEX MICROSCOPIC  RAPID URINE DRUG SCREEN, HOSP PERFORMED  PHENYTOIN LEVEL, TOTAL  LAMOTRIGINE LEVEL  CBG MONITORING, ED    EKG EKG Interpretation Date/Time:  Tuesday March 20 2024 16:10:32 EDT Ventricular  Rate:  82 PR Interval:  192 QRS Duration:  107 QT Interval:  382 QTC Calculation: 447 R Axis:   41  Text Interpretation: Sinus rhythm Confirmed by Vivi Barrack [phone]) on 03/20/2024 5:04:16 PM  Radiology No results found.  Procedures Procedures    Medications Ordered in ED Medications  LORazepam (ATIVAN) 2 MG/ML injection (  Not Given 03/20/24 2332)  levETIRAcetam (KEPPRA) 3,000 mg in sodium chloride 0.9 % 250 mL IVPB (3,000 mg Intravenous New Bag/Given 03/20/24 2341)  acetaminophen (TYLENOL) tablet 1,000 mg (1,000 mg Oral Given 03/20/24 2024)  phenytoin (DILANTIN) ER capsule 300 mg (300 mg Oral Given 03/20/24 2110)    ED Course/ Medical Decision Making/ A&P                          Medical Decision Making Amount and/or Complexity of Data Reviewed Labs: ordered. Decision-making details documented in ED Course.  Risk OTC drugs. Prescription drug management. Decision regarding hospitalization.    This patient presents to the ED for concern of seizure, this involves an extensive number of treatment options, and is a complaint that carries with it a high risk of complications and morbidity.  I considered the following differential and admission for this acute, potentially life threatening condition.   MDM:    Patient with known seizure disorder presents w/ first seizure in eight years. Reports  medication compliance. Consider his known seizure disorder as potential cause. Also considered benzodiazepine withdrawal as a possible etiology of his seizures.  However, patient does not have any other symptoms of benzodiazepine withdrawal.  He did mention mild anxiety earlier today but has not had any tachycardia, hyperthermia, nausea vomiting, diaphoresis that I would associate with severe withdrawal leading to seizures.  Considered that maybe his decreased Klonopin dose could have potentially lowered his seizure threshold in the setting of his known seizure disorder. He does not have any electrolyte abnormalities, hypo-/hyperglycemia, indication of meningitis or pres. No sxs to indicate alcohol withdrawal. Patient did not hit his head and has no headache, no neck pain, no need for advanced imaging. He feels well and has no sxs.   Clinical Course as of 03/20/24 2342  Tue Mar 20, 2024  1916 CBC with Differential/Platelet(!) wnl [HN]  2045 Comprehensive metabolic panel(!) wnl [HN]  2045 Magnesium: 2.0 wnl [HN]  2207 Urinalysis, Routine w reflex microscopic -Urine, Clean Catch neg [HN]  2336 Patient had been observed for nearly 7 hours in the ED with no further seizure like activity. He had received his nighttime dose of dilantin. Discussed with patient about continuing his klonopin, dilantin, and lamictal as prescribed and following up with neurologist in clinic. DC instructoins/return precautions for seizure were given to both patient and his brother. Patient mentioned that he had muffled sounds in left ear which on exam was impacted with cerumen, so BSRN evacuated cerumen from ear with warm water flush, no pain. On repeat exam TM is intact. Minutes afterward, patient had a 5 minute long generalized tonic clonic seizure with tongue biting. It ended with no medications and airway was supported with jaw thrust and also NRB at 15L /min. Sat 100% on NRB, and HR was 100s. Patient's seizure broke and he  became responsive to pain, groaning, protecting his airway. Patient will be consulted to neurology and admitted to medicine. [HN]    Clinical Course User Index [HN] Loetta Rough, MD    Labs: I Ordered, and personally interpreted labs.  The pertinent  results include:  those listed above  Additional history obtained from chart review.    Cardiac Monitoring: The patient was maintained on a cardiac monitor.  I personally viewed and interpreted the cardiac monitored which showed an underlying rhythm of: NSR, ST  Reevaluation: After the interventions noted above, I reevaluated the patient and found that they have :worsened  Social Determinants of Health: Lives independently  Disposition:  Admitted to medicine w/ neuro consulted  Co morbidities that complicate the patient evaluation  Past Medical History:  Diagnosis Date   Anxiety    Depression    Diabetes mellitus without complication (HCC)    no longer on medication   Hyperlipidemia    Hypertension    OCD (obsessive compulsive disorder)    per patient   Seizures (HCC)      Medicines Meds ordered this encounter  Medications   acetaminophen (TYLENOL) tablet 1,000 mg   phenytoin (DILANTIN) ER capsule 300 mg   LORazepam (ATIVAN) 2 MG/ML injection    [place], Caitlin : cabinet override   levETIRAcetam (KEPPRA) 3,000 mg in sodium chloride 0.9 % 250 mL IVPB    I have reviewed the patients home medicines and have made adjustments as needed  Problem List / ED Course: Problem List Items Addressed This Visit   None Visit Diagnoses       Seizure (HCC)    -  Primary   Relevant Medications   phenytoin (DILANTIN) ER capsule 300 mg (Completed)   levETIRAcetam (KEPPRA) 3,000 mg in sodium chloride 0.9 % 250 mL IVPB                   This note was created using dictation software, which may contain spelling or grammatical errors.    Loetta Rough, MD 03/20/24 [phone]

## 2024-03-20 NOTE — ED Notes (Signed)
 Pt had seizure like activity, pt brother at bedside called for help. Multiple RN at bedside MD at bedside. Head protected, Airway protected with jaw thrust maneuver, Pt placed on Non-rebreather at 15L, Seizure like activity subsided. Ativan held per MD verbal order at bedside. Keprra ordered (SEE MAR). Pt placed on cardiac monitor. Pt is lethargic at this time. Placed on 4L nasal canula. Brother at bedside.

## 2024-03-20 NOTE — Discharge Instructions (Signed)
 Thank you for coming to [facility] Emergency Department. You were seen for seizure. We did an exam, labs, and imaging, and these showed  no acute findings.  Please follow up with your neurologist within 1-2 weeks.  Do not hesitate to return to the ED or call 911 if you experience: -Worsening symptoms -Lightheadedness, passing out -Fevers/chills -Anything else that concerns you

## 2024-03-21 ENCOUNTER — Observation Stay (HOSPITAL_COMMUNITY)

## 2024-03-21 DIAGNOSIS — R569 Unspecified convulsions: Secondary | ICD-10-CM

## 2024-03-21 DIAGNOSIS — F411 Generalized anxiety disorder: Secondary | ICD-10-CM

## 2024-03-21 DIAGNOSIS — E785 Hyperlipidemia, unspecified: Secondary | ICD-10-CM

## 2024-03-21 DIAGNOSIS — I10 Essential (primary) hypertension: Secondary | ICD-10-CM

## 2024-03-21 DIAGNOSIS — S82852D Displaced trimalleolar fracture of left lower leg, subsequent encounter for closed fracture with routine healing: Secondary | ICD-10-CM

## 2024-03-21 LAB — MAGNESIUM: Magnesium: 2 mg/dL (ref 1.7–2.4)

## 2024-03-21 LAB — CBC
HCT: 39.5 % (ref 39.0–52.0)
Hemoglobin: 14.2 g/dL (ref 13.0–17.0)
MCH: 35.1 pg — ABNORMAL HIGH (ref 26.0–34.0)
MCHC: 35.9 g/dL (ref 30.0–36.0)
MCV: 97.8 fL (ref 80.0–100.0)
Platelets: 193 10*3/uL (ref 150–400)
RBC: 4.04 MIL/uL — ABNORMAL LOW (ref 4.22–5.81)
RDW: 12.4 % (ref 11.5–15.5)
WBC: 8.8 10*3/uL (ref 4.0–10.5)
nRBC: 0 % (ref 0.0–0.2)

## 2024-03-21 LAB — LAMOTRIGINE LEVEL: Lamotrigine Lvl: 1 ug/mL — ABNORMAL LOW (ref 2.0–20.0)

## 2024-03-21 LAB — BASIC METABOLIC PANEL
Anion gap: 8 (ref 5–15)
BUN: 15 mg/dL (ref 8–23)
CO2: 23 mmol/L (ref 22–32)
Calcium: 8.9 mg/dL (ref 8.9–10.3)
Chloride: 108 mmol/L (ref 98–111)
Creatinine, Ser: 0.66 mg/dL (ref 0.61–1.24)
GFR, Estimated: >60 mL/min (ref >60)
Glucose, Bld: 123 mg/dL — ABNORMAL HIGH (ref 70–99)
Potassium: 3.3 mmol/L — ABNORMAL LOW (ref 3.5–5.1)
Sodium: 139 mmol/L (ref 135–145)

## 2024-03-21 LAB — PHOSPHORUS: Phosphorus: 3.3 mg/dL (ref 2.5–4.6)

## 2024-03-21 MED ORDER — DIPHENHYDRAMINE HCL 25 MG PO CAPS: 25.0000 mg | ORAL_CAPSULE | Freq: Four times a day (QID) | ORAL | Status: DC | PRN

## 2024-03-21 MED ORDER — FENOFIBRATE 160 MG PO TABS
160.0000 mg | ORAL_TABLET | Freq: Every morning | ORAL | Status: DC
  Administered 2024-03-22: 160 mg via ORAL
  Filled 2024-03-21: qty 1

## 2024-03-21 MED ORDER — IRBESARTAN 300 MG PO TABS
300.0000 mg | ORAL_TABLET | Freq: Every evening | ORAL | Status: DC
  Administered 2024-03-21: 300 mg via ORAL
  Filled 2024-03-21: qty 1

## 2024-03-21 MED ORDER — POLYETHYLENE GLYCOL 3350 17 G PO PACK: 17.0000 g | PACK | Freq: Every day | ORAL | Status: DC | PRN

## 2024-03-21 MED ORDER — CLONAZEPAM 0.5 MG PO TABS: 0.5000 mg | ORAL_TABLET | Freq: Every day | ORAL | Status: DC

## 2024-03-21 MED ORDER — CLONAZEPAM 0.5 MG PO TABS: 0.5000 mg | ORAL_TABLET | Freq: Two times a day (BID) | ORAL | Status: DC | PRN

## 2024-03-21 MED ORDER — CLONAZEPAM 0.5 MG PO TABS: 0.5000 mg | ORAL_TABLET | Freq: Once | ORAL | Status: DC

## 2024-03-21 MED ORDER — CYCLOBENZAPRINE HCL 10 MG PO TABS: 5.0000 mg | ORAL_TABLET | Freq: Three times a day (TID) | ORAL | Status: DC | PRN

## 2024-03-21 MED ORDER — LAMOTRIGINE 100 MG PO TABS
100.0000 mg | ORAL_TABLET | Freq: Every day | ORAL | Status: DC
  Administered 2024-03-21 – 2024-03-22 (×2): 100 mg via ORAL
  Filled 2024-03-21 (×2): qty 1

## 2024-03-21 MED ORDER — ACETAMINOPHEN 325 MG PO TABS
650.0000 mg | ORAL_TABLET | Freq: Four times a day (QID) | ORAL | Status: DC | PRN
  Administered 2024-03-21: 650 mg via ORAL
  Filled 2024-03-21: qty 2

## 2024-03-21 MED ORDER — PROCHLORPERAZINE EDISYLATE 10 MG/2ML IJ SOLN: 5.0000 mg | Freq: Four times a day (QID) | INTRAMUSCULAR | Status: DC | PRN

## 2024-03-21 MED ORDER — METOPROLOL TARTRATE 25 MG PO TABS
25.0000 mg | ORAL_TABLET | Freq: Two times a day (BID) | ORAL | Status: DC
  Administered 2024-03-21 – 2024-03-22 (×2): 25 mg via ORAL
  Filled 2024-03-21 (×2): qty 1

## 2024-03-21 MED ORDER — OXYCODONE HCL 5 MG PO TABS: 5.0000 mg | ORAL_TABLET | Freq: Four times a day (QID) | ORAL | Status: DC | PRN

## 2024-03-21 MED ORDER — PAROXETINE HCL 10 MG PO TABS
10.0000 mg | ORAL_TABLET | Freq: Every morning | ORAL | Status: DC
  Administered 2024-03-22: 10 mg via ORAL
  Filled 2024-03-21 (×2): qty 1

## 2024-03-21 MED ORDER — CLONAZEPAM 1 MG PO TABS
1.0000 mg | ORAL_TABLET | Freq: Every day | ORAL | Status: DC
  Administered 2024-03-21: 1 mg via ORAL
  Filled 2024-03-21: qty 1

## 2024-03-21 MED ORDER — LACTATED RINGERS IV SOLN: INTRAVENOUS | Status: DC

## 2024-03-21 MED ORDER — SENNOSIDES-DOCUSATE SODIUM 8.6-50 MG PO TABS
1.0000 | ORAL_TABLET | Freq: Every day | ORAL | Status: DC
Start: 2024-03-21 — End: 2024-03-22
  Filled 2024-03-21: qty 1

## 2024-03-21 MED ORDER — ASPIRIN 81 MG PO TBEC
81.0000 mg | DELAYED_RELEASE_TABLET | Freq: Every day | ORAL | Status: DC
  Administered 2024-03-22: 81 mg via ORAL
  Filled 2024-03-21: qty 1

## 2024-03-21 MED ORDER — PHENYTOIN SODIUM EXTENDED 100 MG PO CAPS
200.0000 mg | ORAL_CAPSULE | Freq: Two times a day (BID) | ORAL | Status: DC
Start: 2024-03-21 — End: 2024-03-21

## 2024-03-21 MED ORDER — PAROXETINE HCL 20 MG PO TABS
40.0000 mg | ORAL_TABLET | Freq: Every morning | ORAL | Status: DC
  Administered 2024-03-22: 40 mg via ORAL
  Filled 2024-03-21 (×2): qty 2

## 2024-03-21 MED ORDER — PHENYTOIN SODIUM EXTENDED 100 MG PO CAPS: 200.0000 mg | ORAL_CAPSULE | Freq: Every day | ORAL | Status: DC

## 2024-03-21 MED ORDER — PHENYTOIN SODIUM EXTENDED 100 MG PO CAPS
300.0000 mg | ORAL_CAPSULE | Freq: Every day | ORAL | Status: DC
Start: 2024-03-21 — End: 2024-03-21

## 2024-03-21 MED ORDER — VALACYCLOVIR HCL 500 MG PO TABS
1000.0000 mg | ORAL_TABLET | Freq: Every morning | ORAL | Status: DC
  Administered 2024-03-22: 1000 mg via ORAL
  Filled 2024-03-21 (×2): qty 2

## 2024-03-21 MED ORDER — PHENYTOIN SODIUM EXTENDED 100 MG PO CAPS
200.0000 mg | ORAL_CAPSULE | Freq: Every day | ORAL | Status: DC
  Administered 2024-03-21 – 2024-03-22 (×2): 200 mg via ORAL
  Filled 2024-03-21 (×2): qty 2

## 2024-03-21 MED ORDER — PHENYTOIN SODIUM EXTENDED 100 MG PO CAPS
300.0000 mg | ORAL_CAPSULE | Freq: Every day | ORAL | Status: DC
Start: 2024-03-21 — End: 2024-03-22
  Administered 2024-03-21: 300 mg via ORAL
  Filled 2024-03-21 (×2): qty 3

## 2024-03-21 MED ORDER — CLONAZEPAM 1 MG PO TABS
1.0000 mg | ORAL_TABLET | Freq: Once | ORAL | Status: AC
  Administered 2024-03-21: 1 mg via ORAL
  Filled 2024-03-21: qty 1

## 2024-03-21 MED ORDER — ROSUVASTATIN CALCIUM 20 MG PO TABS
40.0000 mg | ORAL_TABLET | Freq: Every evening | ORAL | Status: DC
  Administered 2024-03-21: 40 mg via ORAL

## 2024-03-21 MED ORDER — POTASSIUM CHLORIDE CRYS ER 20 MEQ PO TBCR
40.0000 meq | EXTENDED_RELEASE_TABLET | Freq: Once | ORAL | Status: AC
  Administered 2024-03-21: 40 meq via ORAL
  Filled 2024-03-21: qty 2

## 2024-03-21 MED ORDER — MELATONIN 5 MG PO TABS
5.0000 mg | ORAL_TABLET | Freq: Every evening | ORAL | Status: DC | PRN
Start: 2024-03-21 — End: 2024-03-22

## 2024-03-21 MED ORDER — CLONAZEPAM 0.5 MG PO TABS
0.5000 mg | ORAL_TABLET | Freq: Every day | ORAL | Status: DC
  Administered 2024-03-22: 0.5 mg via ORAL
  Filled 2024-03-21: qty 1

## 2024-03-21 NOTE — ED Notes (Signed)
 Pt easily reoriented to time. Occasionally confusing his birth year with the current year. Pt expressing that he is tired at this time.

## 2024-03-21 NOTE — H&P (Addendum)
 History and Physical  Theodore Gutierrez:[id number] DOB: 1956/05/08 DOA: 03/20/2024  Referring physician: Dr. Jearld Fenton, EDP  PCP: Theodore Perna, MD  Outpatient Specialists: Neurology, orthopedic surgery. Patient coming from: Home, lives with his brother.  Chief Complaint: Seizure activity.  HPI: Theodore Gutierrez is a 68 y.o. male with medical history significant for seizure disorder (no seizure activity for at least 8 years) on phenytoin and Lamictal, hyperlipidemia, generalized anxiety, who presents to the ER via EMS after a witnessed seizure at home by his brother.  Upon EMS arrival, the patient was postictal.  Reportedly, the patient had ORIF for his left lower extremity with continued postop pain.  He was taking hydrocodone for the pain and was trying to wean himself off his home Klonopin which he takes for his anxiety.  In the ER, the patient had another seizure-like activity.  Phenytoin level was within normal range.  Afebrile with no leukocytosis.  The patient was given a loading dose of Keppra and was restarted on his home Dilantin.  EDP consulted neurology.  The patient was admitted by [facility], hospitalist service, for further management.  ED Course: Temperature 98.  BP 114/76, pulse 75, respiratory rate 18, saturation 95% on room air.  CBC and CMP were unremarkable.  UA was negative for pyuria.  Review of Systems: Review of systems as noted in the HPI. All other systems reviewed and are negative.   Past Medical History:  Diagnosis Date   Anxiety    Depression    Diabetes mellitus without complication (HCC)    no longer on medication   Hyperlipidemia    Hypertension    OCD (obsessive compulsive disorder)    per patient   Seizures [facility])    Past Surgical History:  Procedure Laterality Date   ORIF ANKLE FRACTURE Left 12/02/2023   Procedure: OPEN REDUCTION INTERNAL FIXATION (ORIF) ANKLE FRACTURE;  Surgeon: Theodore Lofts, MD;  Location: [facility] OR;  Service: Orthopedics;  Laterality: Left;    TONSILLECTOMY     68 years old    Social History:  reports that he has never smoked. He has never used smokeless tobacco. He reports that he does not drink alcohol and does not use drugs.   Allergies  Allergen Reactions   Oxycodone Itching    Family History  Problem Relation Age of Onset   Esophageal cancer Maternal Grandfather    Colon cancer Neg Hx    Colon polyps Neg Hx    Rectal cancer Neg Hx    Stomach cancer Neg Hx       Prior to Admission medications   Medication Sig Start Date End Date Taking? Authorizing Provider  aspirin EC 81 MG tablet Take 81 mg by mouth in the morning.    [provider]  Cholecalciferol (VITAMIN D3) 10 MCG (400 UNIT) tablet Take 800 Units by mouth daily with lunch.    [provider]  clonazePAM (KLONOPIN) 1 MG tablet Take 1 mg by mouth 3 (three) times daily as needed for anxiety. 01/25/16   [provider]  fenofibrate 160 MG tablet Take 160 mg by mouth in the morning. 02/15/16   [provider]  ibuprofen (ADVIL) 800 MG tablet Take 800 mg by mouth in the morning.    [provider]  irbesartan (AVAPRO) 300 MG tablet Take 300 mg by mouth every evening. 01/02/16   [provider]  lamoTRIgine (LAMICTAL) 100 MG tablet Take 100 mg by mouth in the morning.    [provider]  lidocaine (XYLOCAINE) 5 % ointment Apply 1 Application topically daily as needed (pain.).    [provider]  methocarbamol (ROBAXIN) 500 MG tablet Take 1 tablet (500 mg total) by mouth every 6 (six) hours as needed for muscle spasms. 12/02/23   West Bali, PA-C  metoprolol tartrate (LOPRESSOR) 25 MG tablet Take 25 mg by mouth 2 (two) times daily.    [provider]  Multiple Vitamins-Minerals (PROBIOTICS + BARIATRIC MULTI) CAPS Take 2 tablets by mouth daily.    [provider]  Multiple Vitamins-Minerals (ZINC PO) Take 1 tablet by mouth daily as needed (immune support).    [provider]  oxyCODONE-acetaminophen (PERCOCET/ROXICET) 5-325 MG tablet Take 1 tablet by mouth every 6 (six) hours as needed for severe pain (pain score 7-10). 12/02/23   [place] Bali, PA-C  PA ECHINACEA PO Take 1 capsule by mouth daily as needed (immune support).    [provider]  PARoxetine (PAXIL) 10 MG tablet Take 10 mg by mouth in the morning. 10 mg + 40 mg= 50 mg    [provider]  PARoxetine (PAXIL) 40 MG tablet Take 40 mg by mouth in the morning. 40 mg + 10 mg= 50 mg    [provider]  phenytoin (DILANTIN) 100 MG ER capsule Take 2-3 capsules (200-300 mg total) by mouth 2 (two) times daily. 300 mg in the am and 200 mg mid day Patient taking differently: Take 200-300 mg by mouth See admin instructions. Take 2 capsules (200 mg) by mouth in the morning & take 3 capsules (300 mg) by mouth at night. 02/22/16   Theodore Art, DO  rosuvastatin (CRESTOR) 40 MG tablet Take 40 mg by mouth every evening.    [provider]  tadalafil (CIALIS) 20 MG tablet Take 20 mg by mouth daily as needed for erectile dysfunction.    [provider]  valACYclovir (VALTREX) 1000 MG tablet Take 1,000 mg by mouth in the morning. 01/20/23   [provider]    Physical Exam: BP 120/74   Pulse 82   Temp 98.1 F (36.7 C) (Oral)   Resp 14   Ht 6' (1.829 m)   Wt 80.3 kg   SpO2 98%   BMI 24.01 kg/m   General: 68 y.o. year-old male well developed well nourished in no acute distress.  Postictal.   Cardiovascular: Regular rate and rhythm with no rubs or gallops.  No thyromegaly or JVD noted.  No lower extremity edema. 2/4 pulses in all 4 extremities. Respiratory: Clear to auscultation with no wheezes or rales. Good inspiratory effort. Abdomen: Soft nontender nondistended with normal bowel sounds x4 quadrants. Muskuloskeletal: No cyanosis, clubbing or edema noted bilaterally Neuro: CN II-XII intact, strength, sensation, reflexes Skin: No ulcerative lesions  noted or rashes Psychiatry: Unable to assess mood or judgment due to postictal.          Labs on Admission:  Basic Metabolic Panel: Recent Labs  Lab 03/20/24 1832  NA 140  K 3.5  CL 105  CO2 25  GLUCOSE 105*  BUN 20  CREATININE 0.90  CALCIUM 9.7  MG 2.0   Liver Function Tests: Recent Labs  Lab 03/20/24 1832  AST 25  ALT 19  ALKPHOS 62  BILITOT 0.6  PROT 6.9  ALBUMIN 3.9   No results for input(s): "LIPASE", "AMYLASE" in the last 168 hours. No results for input(s): "AMMONIA" in the last 168 hours. CBC: Recent Labs  Lab 03/20/24 1832  WBC 7.4  NEUTROABS 6.0  HGB 15.1  HCT 42.0  MCV 98.8  PLT 201   Cardiac Enzymes: No results for input(s): "CKTOTAL", "CKMB", "CKMBINDEX", "TROPONINI" in the last 168 hours.  BNP (last 3 results) No results for input(s): "BNP" in the last 8760 hours.  ProBNP (last 3 results) No results for input(s): "PROBNP" in the last 8760 hours.  CBG: No results for input(s): "GLUCAP" in the last 168 hours.  Radiological Exams on Admission: No results found.  EKG: I independently viewed the EKG done and my findings are as followed: None available at the time of this visit.  Assessment/Plan Present on Admission: **None**  Active Problems:   Seizure (HCC)  Seizure disorder with breakthrough seizures. Follow EEG Dilantin level within normal range Last seizure activity was more than 8 years ago Unclear trigger, possibly benzodiazepine withdrawal Reportedly was weaning himself off home Klonopin Received loading dose of IV Keppra in the ER Restarted home phenytoin and Lamictal IV Ativan as needed for breakthrough seizures. Neurology consulted by EDP. Seizure precautions  Chronic anxiety On Klonopin prior to admission Resume home Klonopin at lower dose 0.5 mg twice daily as needed for anxiety  Hyperlipidemia Resume home Crestor.  Status post ORIF of left lower extremity with continued postop pain.  Continue pain control and  bowel regimen   Time: 75 minutes.   DVT prophylaxis: Subcu Lovenox daily.  Code Status: Full code.  Family Communication: Updated the patient's brother at bedside.  Disposition Plan: Admitted to telemetry medical unit.  Consults called: Neurology consulted by EDP.  Admission status: Observation status.   Status is: Observation    Darlin Drop MD [facility] Pager [phone]  If 7PM-7AM, please contact night-coverage [email or web address] Password [facility]  03/21/2024, 12:14 AM

## 2024-03-21 NOTE — Progress Notes (Signed)
 EEG complete - results pending

## 2024-03-21 NOTE — TOC Transition Note (Signed)
 Transition of Care [facility]) - Discharge Note   Patient Details  Name: Theodore Gutierrez MRN: [id number] Date of Birth: 1956/08/26  [facility]) CM/SW Contact:  Kermit Balo, RN Phone Number: 03/21/2024, 1:48 PM   Clinical Narrative:     Pt is discharging home with self care. He is from home with his brother.  Brother can assist with transportation after discharge. (Pt normally drives self) Pt denies issues with home medications other than with stopping his klonopin due to pain medication started for his foot.  DME at home: cane/ walker/ boot for foot Brother will provide transportation home.  Final next level of care: Home/Self Care Barriers to Discharge: No Barriers Identified   Patient Goals and CMS Choice            Discharge Placement                       Discharge Plan and Services Additional resources added to the After Visit Summary for                                       Social Drivers of Health (SDOH) Interventions SDOH Screenings   Food Insecurity: No Food Insecurity (03/21/2024)  Housing: Low Risk  (03/21/2024)  Transportation Needs: No Transportation Needs (03/21/2024)  Utilities: Not At Risk (03/21/2024)  Financial Resource Strain: Low Risk  (08/24/2022)   Received from [facility], [facility] visits prior to 02/26/2023.  Physical Activity: Sufficiently Active (08/24/2022)   Received from [facility], [facility] visits prior to 02/26/2023.  Social Connections: Moderately Isolated (03/21/2024)  Stress: Stress Concern Present (08/24/2022)   Received from [facility], [facility] visits prior to 02/26/2023.  Tobacco Use: Low Risk  (03/20/2024)     Readmission Risk Interventions     No data to display

## 2024-03-21 NOTE — Progress Notes (Addendum)
 PROGRESS NOTE    Theodore Gutierrez  UEA:[id number] DOB: [birth year] DOA: 03/20/2024 PCP: Truett Perna, MD   Chief Complaint  Patient presents with   Seizures    Brief Narrative: Patient 68 year old gentleman history of seizure disorder with no seizure activity over the last 8 years on phenytoin, Lamictal, history of hyperlipidemia, general anxiety presented to the ED after witnessed seizure at home by his brother.  Upon EMS arrival patient noted to be postictal.  Patient noted to have had an ORIF of left lower extremity with continued postop pain was taking hydrocodone for pain and tried to wean himself off his Klonopin which he stopped cold [place] as he was told not to mix both medications.  Patient on presentation to the ED had another seizure, phenytoin within normal limits.  Patient admitted.  Neurology consulted.   Assessment & Plan:   Principal Problem:   Seizure (HCC) Active Problems:   Breakthrough seizure (HCC)   HTN (hypertension)   Hyperlipemia   Anxiety state   Closed displaced trimalleolar fracture of left ankle   #1 seizure disorder with breakthrough seizures -Patient presented with seizures witnessed at home by his brother and also on presentation in the ED. -Likely secondary to abrupt discontinuation of Klonopin anxiolytics as patient tried to wean himself off of it. -Lamotrigine level at 1.  Phenytoin level within normal limits at 13.8. -No signs of an acute infectious process. -Patient seen in consultation by neurology who feel that patient's seizure threshold was lowered in the setting of benzodiazepine withdrawal. -EEG ordered and pending. -Neurology recommending to be started on a tapered dose of Klonopin as noted in the note. -Continue home regimen Lamictal and Dilantin. -Outpatient follow-up with primary neurologist.  2.  Hypokalemia -Replete.  3.  Chronic anxiety -Continue current dose of Klonopin and placed on a Klonopin taper on discharge as recommended  by neurology as patient interested in weaning himself off Klonopin.  4.  Hyperlipidemia -Crestor.  5.  Status post ORIF of left lower extremity with continued postop pain -Continue pain control, bowel regimen. -PT/OT.  6.  Hypertension -Saline lock IV fluids. -Resume home regimen Avapro and Lopressor.   DVT prophylaxis: Lovenox Code Status: Full Family Communication: Updated patient and brother at bedside. Disposition: Likely home if continued clinical improvement in the next 24 hours.  Status is: Observation The patient remains OBS appropriate and will d/c before 2 midnights.   Consultants:  Neurology: Dr. Amada Jupiter 03/21/2024  Procedures:  EEG 03/21/2024   Antimicrobials:  Anti-infectives (From admission, onward)    None         Subjective: Patient sitting up.  With EEG electrodes on.  Patient denies any chest pain, no shortness of breath, no abdominal pain.  No further syncopal episodes noted during the hospitalization.  Brother at bedside.  Objective: Vitals:   03/21/24 0809 03/21/24 1100 03/21/24 1143 03/21/24 1520  BP: (!) 149/88 (!) 136/93 (!) 136/93 (!) 144/99  Pulse: 84 88 88 88  Resp: 18 18 18 18   Temp: 98.3 F (36.8 C) 98.1 F (36.7 C) 98.1 F (36.7 C) 98.3 F (36.8 C)  TempSrc: Oral Oral Oral Oral  SpO2: 90% 94% 95% 95%  Weight:      Height:        Intake/Output Summary (Last 24 hours) at 03/21/2024 1710 Last data filed at 03/21/2024 1402 Gross per 24 hour  Intake --  Output 1 ml  Net -1 ml   Filed Weights   03/20/24 1613 03/21/24 0220  Weight: 80.3 kg (P) 83 kg    Examination:  General exam: Appears calm and comfortable  Respiratory system: Clear to auscultation. Respiratory effort normal. Cardiovascular system: S1 & S2 heard, RRR. No JVD, murmurs, rubs, gallops or clicks. No pedal edema. Gastrointestinal system: Abdomen is nondistended, soft and nontender. No organomegaly or masses felt. Normal bowel sounds heard. Central nervous  system: Alert and oriented. No focal neurological deficits. Extremities: Symmetric 5 x 5 power. Skin: No rashes, lesions or ulcers Psychiatry: Judgement and insight appear normal. Mood & affect appropriate.     Data Reviewed: I have personally reviewed following labs and imaging studies  CBC: Recent Labs  Lab 03/20/24 1832 03/21/24 0516  WBC 7.4 8.8  NEUTROABS 6.0  --   HGB 15.1 14.2  HCT 42.0 39.5  MCV 98.8 97.8  PLT 201 193    Basic Metabolic Panel: Recent Labs  Lab 03/20/24 1832 03/21/24 0516  NA 140 139  K 3.5 3.3*  CL 105 108  CO2 25 23  GLUCOSE 105* 123*  BUN 20 15  CREATININE 0.90 0.66  CALCIUM 9.7 8.9  MG 2.0 2.0  PHOS  --  3.3    GFR: Estimated Creatinine Clearance: 99.7 mL/min (by C-G formula based on SCr of 0.66 mg/dL).  Liver Function Tests: Recent Labs  Lab 03/20/24 1832  AST 25  ALT 19  ALKPHOS 62  BILITOT 0.6  PROT 6.9  ALBUMIN 3.9    CBG: No results for input(s): "GLUCAP" in the last 168 hours.   No results found for this or any previous visit (from the past 240 hours).       Radiology Studies: No results found.      Scheduled Meds:  [START ON 03/22/2024] clonazePAM  0.5 mg Oral Daily   clonazePAM  1 mg Oral QHS   enoxaparin (LOVENOX) injection  40 mg Subcutaneous Daily   lamoTRIgine  100 mg Oral Daily   phenytoin  200 mg Oral Daily   And   phenytoin  300 mg Oral QHS   rosuvastatin  40 mg Oral QPM   senna-docusate  1 tablet Oral QHS   Continuous Infusions:  lactated ringers 75 mL/hr at 03/21/24 0319     LOS: 0 days    Time spent: 40 minutes    Ramiro Harvest, MD [facility]   To contact the attending provider between 7A-7P or the covering provider during after hours 7P-7A, please log into the web site [email or web address] and access using universal [place] password for that web site. If you do not have the password, please call the hospital operator.  03/21/2024, 5:10 PM

## 2024-03-21 NOTE — Plan of Care (Signed)

## 2024-03-21 NOTE — Plan of Care (Signed)
  Problem: Education: Goal: Knowledge of General Education information will improve Description: Including pain rating scale, medication(s)/side effects and non-pharmacologic comfort measures 03/21/2024 1659 by Genevie Ann, RN Outcome: Progressing 03/21/2024 1626 by Genevie Ann, RN Outcome: Progressing   Problem: Health Behavior/Discharge Planning: Goal: Ability to manage health-related needs will improve Outcome: Progressing

## 2024-03-21 NOTE — Consult Note (Addendum)
 NEUROLOGY CONSULT NOTE   Date of service: March 21, 2024 Patient Name: Theodore Gutierrez MRN:  [id number] DOB:  1956-05-12 Chief Complaint: "Seizure" Requesting Provider: Rodolph Bong, MD  History of Present Illness  Theodore Gutierrez is a 68 y.o. male with hx of seizures, managed on lamotrigine and phenytoin with no seizures in approximately 8 years who had two breakthrough seizures yesterday.  Of note, he has been on Klonopin 1 mg twice daily for approximately 2 years for anxiety, but recently broke his foot and was having foot pain and was prescribed hydrocodone and was told not to combine the two so he stopped the Klonopin with only a single dose on Sunday, and none on Monday.  He was feeling fairly anxious and jittery prior to his seizures yesterday as well.  Past History   Past Medical History:  Diagnosis Date   Anxiety    Depression    Diabetes mellitus without complication (HCC)    no longer on medication   Hyperlipidemia    Hypertension    OCD (obsessive compulsive disorder)    per patient   Seizures (HCC)     Past Surgical History:  Procedure Laterality Date   ORIF ANKLE FRACTURE Left 12/02/2023   Procedure: OPEN REDUCTION INTERNAL FIXATION (ORIF) ANKLE FRACTURE;  Surgeon: Haddix, Kevin P, MD;  Location: [facility] OR;  Service: Orthopedics;  Laterality: Left;   TONSILLECTOMY     68  years old    Family History: Family History  Problem Relation Age of Onset   Esophageal cancer Maternal Grandfather    Colon cancer Neg Hx    Colon polyps Neg Hx    Rectal cancer Neg Hx    Stomach cancer Neg Hx     Social History  reports that he has never smoked. He has never used smokeless tobacco. He reports that he does not drink alcohol and does not use drugs.  Allergies  Allergen Reactions   Oxycodone Itching    Medications   Current Facility-Administered Medications:    acetaminophen (TYLENOL) tablet 650 mg, 650 mg, Oral, Q6H PRN, Hall, Carole N, DO   clonazePAM  (KLONOPIN) tablet 0.5 mg, 0.5 mg, Oral, BID PRN, Hall, Carole N, DO   diphenhydrAMINE (BENADRYL) capsule 25 mg, 25 mg, Oral, Q6H PRN, Hall, Carole N, DO   enoxaparin (LOVENOX) injection 40 mg, 40 mg, Subcutaneous, Daily, Hall, Carole N, DO, 40 mg at 03/21/24 0901   lactated ringers infusion, , Intravenous, Continuous, Darlin Drop, DO, Last Rate: 75 mL/hr at 03/21/24 0319, New Bag at 03/21/24 0319   lamoTRIgine (LAMICTAL) tablet 100 mg, 100 mg, Oral, Daily, Hall, Carole N, DO, 100 mg at 03/21/24 0900   LORazepam (ATIVAN) injection 2 mg, 2 mg, Intravenous, Q6H PRN, Hall, Carole N, DO   melatonin tablet 5 mg, 5 mg, Oral, QHS PRN, Margo Aye, Carole N, DO   oxyCODONE (Oxy IR/ROXICODONE) immediate release tablet 5 mg, 5 mg, Oral, Q6H PRN, Margo Aye, Carole N, DO   phenytoin (DILANTIN) ER capsule 200 mg, 200 mg, Oral, Daily, 200 mg at 03/21/24 0901 **AND** phenytoin (DILANTIN) ER capsule 300 mg, 300 mg, Oral, QHS, Hall, Carole N, DO   polyethylene glycol (MIRALAX / GLYCOLAX) packet 17 g, 17 g, Oral, Daily PRN, Hall, Carole N, DO   potassium chloride SA (KLOR-CON M) CR tablet 40 mEq, 40 mEq, Oral, Once, Rodolph Bong, MD   prochlorperazine (COMPAZINE) injection 5 mg, 5 mg, Intravenous, Q6H PRN, Hall, Carole N, DO   rosuvastatin (CRESTOR) tablet  40 mg, 40 mg, Oral, QPM, Hall, Carole N, DO   senna-docusate (Senokot-S) tablet 1 tablet, 1 tablet, Oral, QHS, Darlin Drop, [place]  Vitals   Vitals:   03/21/24 0115 03/21/24 0130 03/21/24 0220 03/21/24 0809  BP: 125/80 123/87 114/76 (!) 149/88  Pulse: 80 79 75 84  Resp: 16 14 18 18   Temp:   98 F (36.7 C) 98.3 F (36.8 C)  TempSrc:   Oral Oral  SpO2: 98% 100% 95% 90%  Weight:   (P) 83 kg   Height:   (P) 6' (1.829 m)     Body mass index is 24.82 kg/m (pended).  Physical Exam   Constitutional: Appears well-developed and well-nourished.   Neurologic Examination    Neuro: Mental Status: Patient is awake, alert, oriented to person, place, month,  year, and situation. Patient is able to give a clear and coherent history. No signs of aphasia or neglect Cranial Nerves: II: Visual Fields are full. Pupils are unequal with the right greater than left III,IV, VI: EOMI without ptosis or diploplia.  V: Facial sensation is symmetric to temperature VII: Facial movement is symmetric.  VIII: hearing is intact to voice X: Uvula elevates symmetrically XII: tongue is midline without atrophy or fasciculations.  Motor: He has good strength confrontation, no drift in either upper extremity Sensory: Sensation is symmetric to light touch and temperature in the arms and legs. Cerebellar: No clear ataxia       Labs/Imaging/Neurodiagnostic studies   CBC:  Recent Labs  Lab 04/12/2024 1832 03/21/24 0516  WBC 7.4 8.8  NEUTROABS 6.0  --   HGB 15.1 14.2  HCT 42.0 39.5  MCV 98.8 97.8  PLT 201 193   Basic Metabolic Panel:  Lab Results  Component Value Date   NA 139 03/21/2024   K 3.3 (L) 03/21/2024   CO2 23 03/21/2024   GLUCOSE 123 (H) 03/21/2024   BUN 15 03/21/2024   CREATININE 0.66 03/21/2024   CALCIUM 8.9 03/21/2024   GFRNONAA >60 03/21/2024   GFRAA >60 02/21/2016   Lipid Panel: No results found for: "LDLCALC" HgbA1c: No results found for: "HGBA1C" Urine Drug Screen:     Component Value Date/Time   LABOPIA NONE DETECTED Apr 12, 2024 2145   COCAINSCRNUR NONE DETECTED 12-Apr-2024 2145   LABBENZ NONE DETECTED 04-12-24 2145   AMPHETMU NONE DETECTED 04/12/24 2145   THCU NONE DETECTED April 12, 2024 2145   LABBARB NONE DETECTED 04-12-24 2145    Alcohol Level No results found for: "ETH" INR No results found for: "INR" APTT No results found for: "APTT" AED levels:  Lab Results  Component Value Date   PHENYTOIN 13.8 April 12, 2024    CT Head without contrast from 2017 (Personally reviewed): Negative  ASSESSMENT   Theodore Gutierrez is a 68 y.o. male with a history of epilepsy and anxiety who abruptly discontinued his Klonopin  on Sunday with breakthrough seizures on Tuesday.  Though this is slightly brief for true withdrawal seizures, my suspicion is that his seizure threshold was lowered in the setting of benzodiazepine withdrawal.  In any case, with his abrupt discontinuation, would not make changes to his underlying seizure medicines which have worked well for many years.  I do think he is going to need a much slower taper off of his Klonopin, which she indicates he still wants to come off of.  He was taking 1 mg twice daily on a regular basis for several years prior to admission.  RECOMMENDATIONS  If he continues to desire to  wean his Klonopin: Clonazepam 0.5 mg in the morning and 1 mg at night for 2 weeks Followed by clonazepam 0.5 mg twice daily for 2 weeks Followed by clonazepam 0.5 mg nightly for 2 weeks Followed by clonazepam 0.25 mg nightly for 2 weeks.  He can discuss his chronic benzodiazepines as well as other anxiety treatments with his prescribing physician as an outpatient.  I have ordered a dose of clonazepam  1mg  to be given prior ot discharge, start the tapered dose tomorrow. ______________________________________________________________________    Stormy Card, MD [facility]

## 2024-03-21 NOTE — Plan of Care (Signed)
   Problem: Education: Goal: Knowledge of General Education information will improve Description Including pain rating scale, medication(s)/side effects and non-pharmacologic comfort measures Outcome: Progressing

## 2024-03-21 NOTE — Evaluation (Signed)
 Occupational Therapy Evaluation Patient Details Name: Theodore Gutierrez MRN: [id number] DOB: [birth year] Today's Date: 03/21/2024   History of Present Illness   Pt is a 68 y/o M presenting to ED On 3/25 after witnessed seizure (1-2 min grand mal seizure), along with another seizure in the ED. PMH includes ORIF of L trimalleolar fx now in CAM boot 11/2023, HLD, HTN, generalized anxiety     Clinical Impressions Pt lives with his brother, reports ind at baseline with ADLs and has been using RW and LLE cam boot for mobility since ankle fx in December. Pt currently needing CGA-min A for ADLs, ind with bed mobility, and CGA for transfers with RW. Pt presenting with impairments listed below, will follow acutely. Anticipate no OT follow up needs at d/c.      If plan is discharge home, recommend the following:   A little help with walking and/or transfers;A little help with bathing/dressing/bathroom;Direct supervision/assist for medications management;Direct supervision/assist for financial management;Assist for transportation;Assistance with cooking/housework     Functional Status Assessment   Patient has had a recent decline in their functional status and demonstrates the ability to make significant improvements in function in a reasonable and predictable amount of time.     Equipment Recommendations   None recommended by OT (pt has all needed DME)     Recommendations for Other Services   PT consult     Precautions/Restrictions   Precautions Precautions: Fall Precaution/Restrictions Comments: per pt is WBAT LLE Required Braces or Orthoses: Other Brace Other Brace: L cam boot Restrictions Weight Bearing Restrictions Per Provider Order: Yes LLE Weight Bearing Per Provider Order: Weight bearing as tolerated     Mobility Bed Mobility Overal bed mobility: Independent                  Transfers Overall transfer level: Needs assistance Equipment used: Rolling walker  (2 wheels) Transfers: Sit to/from Stand Sit to Stand: Contact guard assist                  Balance Overall balance assessment: Needs assistance Sitting-balance support: Feet supported Sitting balance-Leahy Scale: Normal Sitting balance - Comments: reaches outside BOS without LOB   Standing balance support: During functional activity, Reliant on assistive device for balance Standing balance-Leahy Scale: Fair Standing balance comment: static standing without AD, RW for ambulation                           ADL either performed or assessed with clinical judgement   ADL Overall ADL's : Needs assistance/impaired Eating/Feeding: Independent   Grooming: Supervision/safety;Standing   Upper Body Bathing: Contact guard assist;Sitting   Lower Body Bathing: Minimal assistance;Sitting/lateral leans   Upper Body Dressing : Contact guard assist;Sitting   Lower Body Dressing: Minimal assistance;Sitting/lateral leans Lower Body Dressing Details (indicate cue type and reason): dons/doffs cam boot Toilet Transfer: Ambulation;Rolling walker (2 wheels);Supervision/safety   Toileting- Architect and Hygiene: Contact guard assist       Functional mobility during ADLs: Supervision/safety       Vision   Vision Assessment?: No apparent visual deficits     Perception Perception: Not tested       Praxis Praxis: Not tested       Pertinent Vitals/Pain Pain Assessment Pain Assessment: Faces Pain Score: 6  Faces Pain Scale: Hurts even more Pain Location: L ankle Pain Descriptors / Indicators: Discomfort Pain Intervention(s): Limited activity within patient's tolerance, Monitored during session  Extremity/Trunk Assessment Upper Extremity Assessment Upper Extremity Assessment: Overall WFL for tasks assessed   Lower Extremity Assessment Lower Extremity Assessment: Defer to PT evaluation (hx L trimalleolar fx 11/2023)   Cervical / Trunk  Assessment Cervical / Trunk Assessment: Kyphotic   Communication Communication Communication: No apparent difficulties   Cognition Arousal: Alert Behavior During Therapy: WFL for tasks assessed/performed Cognition: No family/caregiver present to determine baseline             OT - Cognition Comments: mild memory deficits as pt asking therapist's name multiple times during session, recalls reason for hospital admission                 Following commands: Intact       Cueing  General Comments   Cueing Techniques: Verbal cues  VSS   Exercises     Shoulder Instructions      Home Living Family/patient expects to be discharged to:: Private residence Living Arrangements: Other relatives (brother) Available Help at Discharge: Family;Available 24 hours/day Type of Home: House Home Access: Stairs to enter [employer] of Steps: 2 Entrance Stairs-Rails: Can reach both Home Layout: Two level;Able to live on main level with bedroom/bathroom Alternate Level Stairs-Number of Steps: flight   Bathroom Shower/Tub: Producer, television/film/video: Standard Bathroom Accessibility: Yes   Home Equipment: Agricultural consultant (2 wheels);Crutches (cam boot)          Prior Functioning/Environment Prior Level of Function : Independent/Modified Independent;Working/employed;Driving;History of Falls (last six months)             Mobility Comments: RW and cam boot all times, has been walking ~1/2 mile in boot at home ADLs Comments: ind    OT Problem List: Decreased strength;Decreased range of motion;Decreased activity tolerance;Impaired balance (sitting and/or standing);Decreased safety awareness   OT Treatment/Interventions: Self-care/ADL training;Therapeutic exercise;Energy conservation;DME and/or AE instruction;Therapeutic activities;Visual/perceptual remediation/compensation;Patient/family education      OT Goals(Current goals can be found in the care plan  section)   Acute Rehab OT Goals Patient Stated Goal: none stated OT Goal Formulation: With patient Time For Goal Achievement: 04/04/24 Potential to Achieve Goals: Good ADL Goals Pt Will Perform Tub/Shower Transfer: Shower transfer;Tub transfer;Independently;ambulating Additional ADL Goal #1: pt will recieve passing score on pillbox assessment in prep for IADLs   OT Frequency:  Min 1X/week    Co-evaluation              AM-PAC OT "6 Clicks" Daily Activity     Outcome Measure Help from another person eating meals?: None Help from another person taking care of personal grooming?: None Help from another person toileting, which includes using toliet, bedpan, or urinal?: A Little Help from another person bathing (including washing, rinsing, drying)?: A Little Help from another person to put on and taking off regular upper body clothing?: A Little Help from another person to put on and taking off regular lower body clothing?: A Little 6 Click Score: 20   End of Session Equipment Utilized During Treatment: Gait belt;Rolling walker (2 wheels) Nurse Communication: Mobility status  Activity Tolerance: Patient tolerated treatment well Patient left: in bed;with call bell/phone within reach;with bed alarm set  OT Visit Diagnosis: Unsteadiness on feet (R26.81);Other abnormalities of gait and mobility (R26.89);Muscle weakness (generalized) (M62.81)                Time: 0981-1914 OT Time Calculation (min): 27 min Charges:  OT General Charges $OT Visit: 1 Visit OT Evaluation $OT Eval Low Complexity: 1 Low OT  Treatments $Self Care/Home Management : 8-22 mins  Carver Fila, OTD, OTR/L SecureChat Preferred Acute Rehab [phone]   Dalphine Handing 03/21/2024, 5:07 PM

## 2024-03-21 NOTE — Procedures (Addendum)
 Patient Name: Theodore Gutierrez  MRN: [id number]  Epilepsy Attending: Charlsie Quest  Referring Physician/Provider: Darlin Drop, DO  Date: 03/21/2024 Duration: 24.26 mins  Patient history:  68 y.o. male with a history of epilepsy and anxiety who abruptly discontinued his Klonopin on Sunday with breakthrough seizures on Tuesday. EEG to evaluate for seizure  Level of alertness: Awake  AEDs during EEG study: LEV, PHT, Clonazepam  Technical aspects: This EEG study was done with scalp electrodes positioned according to the 10-20 International system of electrode placement. Electrical activity was reviewed with band pass filter of 1-70Hz , sensitivity of 7 uV/mm, display speed of 35mm/sec with a 60Hz  notched filter applied as appropriate. EEG data were recorded continuously and digitally stored.  Video monitoring was available and reviewed as appropriate.  Description: The posterior dominant rhythm consists of 9-10 Hz activity of moderate voltage (25-35 uV) seen predominantly in posterior head regions, symmetric and reactive to eye opening and eye closing. Physiologic photic driving was not seen during photic stimulation.  Hyperventilation was not performed.     IMPRESSION: This study is within normal limits. No seizures or epileptiform discharges were seen throughout the recording.  A normal interictal EEG does not exclude the diagnosis of epilepsy.  Theodore Gutierrez [name]

## 2024-03-22 DIAGNOSIS — G40919 Epilepsy, unspecified, intractable, without status epilepticus: Secondary | ICD-10-CM

## 2024-03-22 DIAGNOSIS — I10 Essential (primary) hypertension: Secondary | ICD-10-CM | POA: Diagnosis not present

## 2024-03-22 DIAGNOSIS — F411 Generalized anxiety disorder: Secondary | ICD-10-CM | POA: Diagnosis not present

## 2024-03-22 DIAGNOSIS — E785 Hyperlipidemia, unspecified: Secondary | ICD-10-CM | POA: Diagnosis not present

## 2024-03-22 LAB — BASIC METABOLIC PANEL WITH GFR
Anion gap: 10 (ref 5–15)
BUN: 14 mg/dL (ref 8–23)
CO2: 23 mmol/L (ref 22–32)
Calcium: 9.3 mg/dL (ref 8.9–10.3)
Chloride: 106 mmol/L (ref 98–111)
Creatinine, Ser: 0.7 mg/dL (ref 0.61–1.24)
GFR, Estimated: >60 mL/min (ref >60)
Glucose, Bld: 96 mg/dL (ref 70–99)
Potassium: 3.8 mmol/L (ref 3.5–5.1)
Sodium: 139 mmol/L (ref 135–145)

## 2024-03-22 LAB — HIV ANTIBODY (ROUTINE TESTING W REFLEX): HIV Screen 4th Generation wRfx: NONREACTIVE

## 2024-03-22 MED ORDER — POLYETHYLENE GLYCOL 3350 17 G PO PACK: 17.0000 g | PACK | Freq: Every day | ORAL | Status: AC | PRN

## 2024-03-22 MED ORDER — CLONAZEPAM 1 MG PO TABS
ORAL_TABLET | ORAL | Status: AC
Start: 2024-04-05 — End: 2024-05-03

## 2024-03-22 MED ORDER — SENNOSIDES-DOCUSATE SODIUM 8.6-50 MG PO TABS: 1.0000 | ORAL_TABLET | Freq: Every day | ORAL | Status: AC

## 2024-03-22 MED ORDER — HYDROCODONE-ACETAMINOPHEN 5-325 MG PO TABS: 1.0000 | ORAL_TABLET | Freq: Four times a day (QID) | ORAL | Status: AC | PRN

## 2024-03-22 MED ORDER — CLONAZEPAM 0.5 MG PO TABS: 0.2500 mg | ORAL_TABLET | Freq: Every day | ORAL | 0 refills | Status: AC

## 2024-03-22 MED ORDER — CLONAZEPAM 1 MG PO TABS: ORAL_TABLET | ORAL | Status: AC

## 2024-03-22 NOTE — Plan of Care (Signed)

## 2024-03-22 NOTE — Evaluation (Signed)
 Physical Therapy Evaluation Patient Details Name: Theodore Gutierrez MRN: [id number] DOB: [birth year] Today's Date: 03/22/2024  History of Present Illness  Pt is a 68 yo presenting to [facility] on 3/25 due to witnessed seizure at home by brother. Pt was postictal on arrival to ED. PMH: Anxiety, depression, DM, HTN, HLD, OCD, seizures, L ankle ORIF 12/6  Clinical Impression  Pt is presenting at mod I for bed mobility, sit to stand and stairs per home set up without an AD. Pt has assistance at home from brother and has been working on physical therapy exercises from home health therapist for previous L ankle ORIF. Currently pt is presenting at baseline level of functioning and no skilled physical therapy services recommended. Pt will be discharged from skilled physical therapy services at this time; please re-consult if further needs arise.          If plan is discharge home, recommend the following: Other (comment) (as needed assistance)     Equipment Recommendations None recommended by PT     Functional Status Assessment Patient has not had a recent decline in their functional status     Precautions / Restrictions Precautions Precautions: Fall Precaution/Restrictions Comments: per pt is WBAT LLE Required Braces or Orthoses: Other Brace Other Brace: L cam boot Restrictions Weight Bearing Restrictions Per Provider Order: Yes LLE Weight Bearing Per Provider Order: Weight bearing as tolerated      Mobility  Bed Mobility Overal bed mobility: Independent      Transfers Overall transfer level: Modified independent Equipment used: None Transfers: Sit to/from Stand Sit to Stand: Modified independent (Device/Increase time)      Ambulation/Gait Ambulation/Gait assistance: Modified independent (Device/Increase time) Gait Distance (Feet): 150 Feet Assistive device: None Gait Pattern/deviations: Step-through pattern, Decreased stance time - left, Antalgic Gait velocity: slightly  decreased Gait velocity interpretation: >2.62 ft/sec, indicative of community ambulatory      Stairs Stairs: Yes Stairs assistance: Modified independent (Device/Increase time) Stair Management: Two rails, Step to pattern, Forwards Number of Stairs: 2       Balance Overall balance assessment: Mild deficits observed, not formally tested       Pertinent Vitals/Pain Pain Assessment Pain Assessment: Faces Faces Pain Scale: Hurts even more Pain Location: L ankle Pain Descriptors / Indicators: Discomfort Pain Intervention(s): Monitored during session, Limited activity within patient's tolerance    Home Living Family/patient expects to be discharged to:: Private residence Living Arrangements: Other relatives (brother) Available Help at Discharge: Family;Available 24 hours/day Type of Home: House Home Access: Stairs to enter Entrance Stairs-Rails: Can reach both Entrance Stairs-Number of Steps: 2 Alternate Level Stairs-Number of Steps: flight Home Layout: Two level;Able to live on main level with bedroom/bathroom Home Equipment: Rolling Walker (2 wheels);Crutches (cam boot)      Prior Function Prior Level of Function : Independent/Modified Independent;Working/employed;Driving;History of Falls (last six months)             Mobility Comments: RW and cam boot all times, has been walking ~1/2 mile in boot at home ADLs Comments: ind     Extremity/Trunk Assessment   Upper Extremity Assessment Upper Extremity Assessment: Defer to OT evaluation    Lower Extremity Assessment Lower Extremity Assessment: Overall WFL for tasks assessed;LLE deficits/detail LLE Deficits / Details: Recent ankle ORIF    Cervical / Trunk Assessment Cervical / Trunk Assessment: Kyphotic  Communication   Communication Communication: No apparent difficulties    Cognition Arousal: Alert Behavior During Therapy: WFL for tasks assessed/performed   PT - Cognitive impairments:  No apparent  impairments     Following commands: Intact       Cueing Cueing Techniques: Verbal cues     General Comments General comments (skin integrity, edema, etc.): No signs/symptoms of cardiac/respirtory distress during session.        Assessment/Plan    PT Assessment Patient does not need any further PT services         PT Goals (Current goals can be found in the Care Plan section)  Acute Rehab PT Goals PT Goal Formulation: All assessment and education complete, DC therapy     AM-PAC PT "6 Clicks" Mobility  Outcome Measure Help needed turning from your back to your side while in a flat bed without using bedrails?: None Help needed moving from lying on your back to sitting on the side of a flat bed without using bedrails?: None Help needed moving to and from a bed to a chair (including a wheelchair)?: None Help needed standing up from a chair using your arms (e.g., wheelchair or bedside chair)?: None Help needed to walk in hospital room?: None Help needed climbing 3-5 steps with a railing? : None 6 Click Score: 24    End of Session Equipment Utilized During Treatment: Gait belt Activity Tolerance: Patient tolerated treatment well Patient left: in bed;with call bell/phone within reach;with bed alarm set Nurse Communication: Mobility status      Time: 1610-9604 PT Time Calculation (min) (ACUTE ONLY): 18 min   Charges:   PT Evaluation $PT Eval Low Complexity: 1 Low   PT General Charges $$ ACUTE PT VISIT: 1 Visit         Harrel Carina, DPT, CLT  Acute Rehabilitation Services Office: [phone] (Secure chat preferred)   Claudia Desanctis 03/22/2024, 9:37 AM

## 2024-03-22 NOTE — Care Management Obs Status (Signed)
 MEDICARE OBSERVATION STATUS NOTIFICATION   Patient Details  Name: Theodore Gutierrez MRN: [id number] Date of Birth: July 30, 1956   Medicare Observation Status Notification Given:  Yes    Kermit Balo, RN 03/22/2024, 10:25 AM

## 2024-03-22 NOTE — Discharge Summary (Signed)
 Physician Discharge Summary  Theodore Gutierrez KGM:[id number] DOB: 06/14/1956 DOA: 03/20/2024  PCP: Truett Perna, MD  Admit date: 03/20/2024 Discharge date: 03/22/2024  Time spent: 60 minutes  Recommendations for Outpatient Follow-up:  Follow-up with Dr. Monia Sabal, neurologist in 2 weeks. Follow-up with Dr. Barbette Merino, psychiatry in 2 weeks for further management of Klonopin taper. Follow-up with Truett Perna, MD in 3 weeks.  On follow-up patient will need a basic metabolic profile done to follow-up on electrolytes and renal function.   Discharge Diagnoses:  Principal Problem:   Seizure [facility]) Active Problems:   Breakthrough seizure (HCC)   HTN (hypertension)   Hyperlipemia   Anxiety state   Closed displaced trimalleolar fracture of left ankle   Discharge Condition: Stable and improved  Diet recommendation: Heart healthy  Filed Weights   03/20/24 1613 03/21/24 0220  Weight: 80.3 kg 83 kg    History of present illness:  HPI per Dr. Marjorie Smolder is a 68 y.o. male with medical history significant for seizure disorder (no seizure activity for at least 8 years) on phenytoin and Lamictal, hyperlipidemia, generalized anxiety, who presents to the ER via EMS after a witnessed seizure at home by his brother.  Upon EMS arrival, the patient was postictal.  Reportedly, the patient had ORIF for his left lower extremity with continued postop pain.  He was taking hydrocodone for the pain and was trying to wean himself off his home Klonopin which he takes for his anxiety.   In the ER, the patient had another seizure-like activity.  Phenytoin level was within normal range.  Afebrile with no leukocytosis.  The patient was given a loading dose of Keppra and was restarted on his home Dilantin.  EDP consulted neurology.  The patient was admitted by [facility], hospitalist service, for further management.   ED Course: Temperature 98.  BP 114/76, pulse 75, respiratory rate 18, saturation 95% on room  air.  CBC and CMP were unremarkable.  UA was negative for pyuria.  Hospital Course:  #1 seizure disorder with breakthrough seizures -Patient presented with seizures witnessed at home by his brother and also on presentation in the ED. -Likely secondary to abrupt discontinuation of Klonopin anxiolytics as patient tried to wean himself off of it abruptly. -Lamotrigine level at 1.  Phenytoin level within normal limits at 13.8. -No signs of an acute infectious process. -Patient seen in consultation by neurology who feel that patient's seizure threshold was lowered in the setting of benzodiazepine withdrawal. -EEG ordered and was within normal limits, no seizures or epileptiform discharges were seen throughout the recording. -Neurology recommended to be started on a tapered dose of Klonopin as noted in their note. -Patient maintained on home regimen Lamictal and Dilantin with no further seizures noted during the hospitalization.  -Outpatient follow-up with primary neurologist.   2.  Hypokalemia -Repleted during the hospitalization..   3.  Chronic anxiety -Patient seen in consultation by neurology and he was felt patient likely had seizure secondary to benzo withdrawal and patient was placed on a clonidine taper as recommended by neurology with outpatient follow-up with his psychiatrist for further management.    4.  Hyperlipidemia -Patient maintained on home regimen Crestor.   5.  Status post ORIF of left lower extremity with continued postop pain -Pain control during the hospitalization.   -Outpatient follow-up.     6.  Hypertension -Patient's home regimen Avapro and Lopressor were resumed during the hospitalization.   -Outpatient follow-up.   Procedures: EEG 03/21/2024  Consultations: Neurology: Dr. Amada Jupiter 03/21/2024    Discharge Exam: Vitals:   03/22/24 0740 03/22/24 1152  BP: (!) 134/95 (!) 139/97  Pulse: 75 78  Resp: 18 18  Temp: 98.5 F (36.9 C) (!) 97.4 F (36.3 C)   SpO2: 97% 94%    General: NAD Cardiovascular: RRR no murmurs rubs or gallops.  No JVD.  No lower extremity edema. Respiratory: Clear to auscultation bilaterally.  No wheezes, no crackles, no rhonchi.  Fair air movement.  Speaking in full sentences.  Discharge Instructions   Discharge Instructions     Diet - low sodium heart healthy   Complete by: As directed    Increase activity slowly   Complete by: As directed       Allergies as of 03/22/2024       Reactions   Roxicodone [oxycodone] Itching        Medication List     TAKE these medications    aspirin EC 81 MG tablet Take 81 mg by mouth daily with lunch.   Centrum Silver [place] 2 each by mouth daily after lunch.   clonazePAM 1 MG tablet Commonly known as: KLONOPIN Take 0.5 tablets (0.5 mg total) by mouth every morning AND 1 tablet (1 mg total) at bedtime. Do all this for 14 days. What changed: See the new instructions.   clonazePAM 1 MG tablet Commonly known as: KlonoPIN Take 0.5 tablets (0.5 mg total) by mouth 2 (two) times daily for 14 days, THEN 0.5 tablets (0.5 mg total) at bedtime for 14 days. Start taking on: April 05, 2024 What changed: You were already taking a medication with the same name, and this prescription was added. Make sure you understand how and when to take each.   clonazePAM 0.5 MG tablet Commonly known as: KlonoPIN Take 0.5 tablets (0.25 mg total) by mouth at bedtime for 14 days. Start taking on: May 02, 2024 What changed: You were already taking a medication with the same name, and this prescription was added. Make sure you understand how and when to take each.   cyclobenzaprine 5 MG tablet Commonly known as: FLEXERIL Take 5 mg by mouth 3 (three) times daily as needed (foot pain).   fenofibrate 160 MG tablet Take 160 mg by mouth in the morning.   HYDROcodone-acetaminophen 5-325 MG tablet Commonly known as: NORCO/VICODIN Take 1 tablet by mouth every 6 (six) hours as needed for  moderate pain (pain score 4-6). What changed:  when to take this reasons to take this   ibuprofen 800 MG tablet Commonly known as: ADVIL Take 1,600 mg by mouth daily as needed for moderate pain (pain score 4-6).   irbesartan 300 MG tablet Commonly known as: AVAPRO Take 300 mg by mouth every evening.   lamoTRIgine 100 MG tablet Commonly known as: LAMICTAL Take 100 mg by mouth in the morning.   lidocaine-prilocaine cream Commonly known as: EMLA Apply 1 Application topically 2 (two) times daily as needed (pain).   metoprolol tartrate 25 MG tablet Commonly known as: LOPRESSOR Take 25 mg by mouth 2 (two) times daily.   PARoxetine 10 MG tablet Commonly known as: PAXIL Take 10 mg by mouth in the morning. 10 mg + 40 mg= 50 mg   PARoxetine 40 MG tablet Commonly known as: PAXIL Take 40 mg by mouth in the morning. 40 mg + 10 mg= 50 mg   phenytoin 100 MG ER capsule Commonly known as: DILANTIN Take 2-3 capsules (200-300 mg total) by mouth 2 (two)  times daily. 300 mg in the am and 200 mg mid day What changed:  when to take this additional instructions   polyethylene glycol 17 g packet Commonly known as: MIRALAX / GLYCOLAX Take 17 g by mouth daily as needed for mild constipation.   rosuvastatin 40 MG tablet Commonly known as: CRESTOR Take 40 mg by mouth every evening.   senna-docusate 8.6-50 MG tablet Commonly known as: Senokot-S Take 1 tablet by mouth at bedtime.   TYLENOL PO Take 4 tablets by mouth 2 (two) times daily as needed (pain).   valACYclovir 1000 MG tablet Commonly known as: VALTREX Take 1,000 mg by mouth in the morning.   VITAMIN D-3 PO Take 2 capsules by mouth daily after lunch.       Allergies  Allergen Reactions   Roxicodone [Oxycodone] Itching    Follow-up Information     Laurena Slimmer, MD. Schedule an appointment as soon as possible for a visit in 2 week(s).   Specialty: Psychiatry Contact information: [address] [place]  [address] [phone]         Barbette Merino D. Schedule an appointment as soon as possible for a visit in 2 week(s).   Contact information: [address] [place] [address] [phone]         Truett Perna, MD. Schedule an appointment as soon as possible for a visit in 3 week(s).   Specialty: Internal Medicine Contact information: [phone] [address] [place] [address] [phone]                  The results of significant diagnostics from this hospitalization (including imaging, microbiology, ancillary and laboratory) are listed below for reference.    Significant Diagnostic Studies: EEG adult Result Date: 03/21/2024 Charlsie Quest, MD     03/21/2024  8:27 PM Patient Name: NAZAR KUAN MRN: [id number] Epilepsy Attending: Charlsie Quest Referring Physician/Provider: Darlin Drop, DO Date: 03/21/2024 Duration: 24.26 mins Patient history:  68 y.o. male with a history of epilepsy and anxiety who abruptly discontinued his Klonopin on Sunday with breakthrough seizures on Tuesday. EEG to evaluate for seizure Level of alertness: Awake AEDs during EEG study: LEV, PHT, Clonazepam Technical aspects: This EEG study was done with scalp electrodes positioned according to the 10-20 International system of electrode placement. Electrical activity was reviewed with band pass filter of 1-70Hz , sensitivity of 7 uV/mm, display speed of 63mm/sec with a 60Hz  notched filter applied as appropriate. EEG data were recorded continuously and digitally stored.  Video monitoring was available and reviewed as appropriate. Description: The posterior dominant rhythm consists of 9-10 Hz activity of moderate voltage (25-35 uV) seen predominantly in posterior head regions, symmetric and reactive to eye opening and eye closing. Physiologic photic driving was not seen during photic stimulation.  Hyperventilation was not performed.   IMPRESSION: This study is within normal limits. No  seizures or epileptiform discharges were seen throughout the recording. A normal interictal EEG does not exclude the diagnosis of epilepsy. Charlsie Quest    Microbiology: No results found for this or any previous visit (from the past 240 hours).   Labs: Basic Metabolic Panel: Recent Labs  Lab 03/20/24 1832 03/21/24 0516 03/22/24 0633  NA 140 139 139  K 3.5 3.3* 3.8  CL 105 108 106  CO2 25 23 23   GLUCOSE 105* 123* 96  BUN 20 15 14   CREATININE 0.90 0.66 0.70  CALCIUM 9.7 8.9 9.3  MG 2.0  2.0  --   PHOS  --  3.3  --    Liver Function Tests: Recent Labs  Lab 03/20/24 1832  AST 25  ALT 19  ALKPHOS 62  BILITOT 0.6  PROT 6.9  ALBUMIN 3.9   No results for input(s): "LIPASE", "AMYLASE" in the last 168 hours. No results for input(s): "AMMONIA" in the last 168 hours. CBC: Recent Labs  Lab 03/20/24 1832 03/21/24 0516  WBC 7.4 8.8  NEUTROABS 6.0  --   HGB 15.1 14.2  HCT 42.0 39.5  MCV 98.8 97.8  PLT 201 193   Cardiac Enzymes: No results for input(s): "CKTOTAL", "CKMB", "CKMBINDEX", "TROPONINI" in the last 168 hours. BNP: BNP (last 3 results) No results for input(s): "BNP" in the last 8760 hours.  ProBNP (last 3 results) No results for input(s): "PROBNP" in the last 8760 hours.  CBG: No results for input(s): "GLUCAP" in the last 168 hours.     Signed:  Ramiro Harvest MD.  [facility] 03/22/2024, 2:47 PM

## 2024-03-22 NOTE — Plan of Care (Signed)

## 2024-08-16 NOTE — Progress Notes (Signed)
 Chief Complaint: Chief Complaint  Patient presents with   Follow-up    Room 8 2 wk f/u L plantar fasciitis and L ingrown toenail       HPI  68 y.o. male presents to clinic today for left great toenail pain.  States that the nail has been irritated and painful for the last few weeks.  Patient rates pain 7 out of 10.  Patient also states that there is some irritation to the skin by his great toe where he has been applying lotion. Patient has a history of ingrown nail to the right great toe.  Patient reports no pain since having a nail avulsion done to that side.  Patient also states that in December he fractured his ankle and was given an AFO brace that he has been wearing while walking and exercising.  He states that the brace has been rubbing against his foot and causing pain.  He noticed that there is some bleeding on his foot from rubbing against the brace.  Patient denies any other pedal complaints.  Patient denies any systemic symptoms.  Denies any fever chills nausea vomiting.  Interval history 08/16/2024: Patient returns to clinic today for follow-up evaluation of left foot partial nail avulsion procedure to left great toe.  Patient states he is progressing well from this.  Patient states previous rash to his left great toe has resolved as well.  Patient states wound formation to his left foot is also healed.  Patient relates worsening pain to his right great toe, lateral nail border.  Patient denies injury.  Patient is here today for further evaluation.   ROS: Constitutional: Negative for activity change, appetite change, chills, fever and unexpected weight change.  HENT: Negative for nosebleeds, trouble swallowing and voice change.   Respiratory: Negative for chest tightness, shortness of breath, wheezing and stridor.   Cardiovascular: Negative for chest pain, palpitations and leg swelling.  Gastrointestinal: Negative for abdominal pain, blood in stool, constipation and diarrhea.   Genitourinary: Negative for flank pain,or  hematuria  VITALS There were no vitals filed for this visit.  Past Medical Hx: Past Medical History:  Diagnosis Date   Anxiety state 12/09/2016   Last Assessment & Plan:  Formatting of this note might be different from the original. This certainly has exacerbated his pain and may be a large part of it.  He is going to seek counseling and understands this.   Diabetes mellitus    Hyperlipemia 02/21/2016   Hypertension    Hypertensive retinopathy of both eyes 06/09/2020   OCD (obsessive compulsive disorder) 01/08/2019   Seizures (HCC)    Sleep apnea 09/13/2020    Current Meds:  Current Outpatient Medications:    aspirin  81 MG chewable tablet *ANTIPLATELET*, Chew 1 tablet (81 mg total) by mouth daily., Disp: , Rfl:    cholecalciferol (VITAMIN D3) 1000 UNIT Tab, Take 1 tablet (1,000 Units total) by mouth daily., Disp: , Rfl:    clonazePAM  (KLONOPIN ) 1 MG tablet, Take 1 tablet (1 mg total) by mouth 3 (three)  times daily as needed for Anxiety., Disp: 90 tablet, Rfl: 2   fenofibrate  (LOFIBRA) 160 MG tablet, Take 1 tablet (160 mg total) by mouth daily., Disp: 90 tablet, Rfl: 3   finasteride (PROSCAR) 5 mg tablet, Take 1 tablet (5 mg total) by mouth daily., Disp: 30 tablet, Rfl: prn   ibuprofen (ADVIL,MOTRIN) 800 MG tablet, Take 1 tablet (800 mg total) by mouth every 8 (eight) hours as needed., Disp: 270 tablet, Rfl:  3   irbesartan  (AVAPRO ) 300 MG tablet, TAKE 1 TABLET(300 MG) BY MOUTH DAILY, Disp: 30 tablet, Rfl: 11   lamoTRIgine  (LAMICTAL ) 100 MG tablet, Take one tablet q hs, Disp: 90 tablet, Rfl: 2   lidocaine  (XYLOCAINE ) 5 % ointment, Apply bid when  having pain, Disp: 30 g, Rfl: 5   metoPROLOL  tartrate (LOPRESSOR ) 25 MG tablet, Take 1 tablet (25 mg total) by mouth 2 times daily., Disp: 180 tablet, Rfl: 3   PARoxetine  (PAXIL ) 10 MG tablet, Take 1 tablet (10 mg total) by mouth daily., Disp: 90 tablet, Rfl: 0   PARoxetine  (PAXIL )  40 MG tablet, Take 1 tablet (40 mg total) by mouth daily., Disp: 90 tablet, Rfl: 0   phenytoin  (DILANTIN  KAPSEAL) 100 MG ER capsule, Take 2 caps po  in the AM and 3 caps po in the PM, Disp: 150 capsule, Rfl: 11   pregabalin (LYRICA) 150 MG capsule, Take 1 capsule (150 mg total) by mouth 3 times daily. (Patient taking differently: Take 1 capsule (150 mg total) by mouth 2 times daily.), Disp: 90 capsule, Rfl: 5   rosuvastatin  (CRESTOR ) 40 MG tablet, TAKE 1 TABLET(40 MG) BY MOUTH DAILY, Disp: 90 tablet, Rfl: 3   tadalafiL (CIALIS) 20 mg tablet, 1 po 4 hrs or more before intercourse, Disp: 10 tablet, Rfl: 11   tiZANidine (ZANAFLEX) 2 MG tablet, TAKE 1 TABLET(2 MG) BY MOUTH AT BEDTIME, Disp: 30 tablet, Rfl: 3   valACYclovir  (VALTREX ) 1000 MG tablet, Take 1 tablet (1,000 mg total) by mouth 3 times daily., Disp: 90 tablet, Rfl: 11  Allergies: No Known Allergies  Exam: Vascular: Palpable dorsalis pedis pulse right. Palpable dorsalis pedis pulse left. Palpable posterior tibial pulse right . Palpable posterior tibial pulse left. No pallor on elevation or dependent rubor. Integument: Prior erythema noted to the medial border of the left great toe has resolved.  No purulence or drainage noted.  Erythema and edema noted to the medial and lateral nail border of the right great toe.  No purulence noted.  Neurological:  Intact sensation via light touch bilateral. Musculoskeletal: Normal muscle mass and tone symmetric, bilateral.  Pain with palpation of the medial and lateral nail border of the right great toe. Imaging: N/a     Encounter Diagnosis  Name Primary?   Paronychia of great toe of right foot Yes      Plan: A focused history and physical exam was performed today.  I discussed the findings with the patient.  Discussed with patient right great toe paronychia in depth.  Decision was made for partial nail avulsion procedure without chemical matrixectomy, see procedure below.  Patient is return  to clinic in 1 week for follow-up evaluation or sooner as needed.  Right great toe partial nail avulsion procedure without chemical matrixectomy  Date/Time: 08/16/2024 3:40 PM  Performed by: Norleen Deward Leyland, DPM Authorized by: Norleen Deward Leyland, DPM  Preparation: Patient was prepped and draped in the usual sterile fashion. Local anesthesia used: yes Anesthesia: digital block  Anesthesia: Local anesthesia used: yes Local Anesthetic: lidocaine  1% without epinephrine  Anesthetic total: 5 mL  Sedation: Patient sedated: no  Patient tolerance: patient tolerated the procedure well with no immediate complications Comments: Procedure: The decision was made for a right great toe medial and lateral nail border partial nail avulsion without chemical matrixectomy.  A verbal consent was obtained.  All questions were answered. The right great toe was then anesthetized ultilizing 5 cc of 1% lidocaine  plain. Prepped and draped sterile with  Betadine surgical prep.  Partial nail avulsion procedure was then performed. Irrigated with copious amounts of saline. Sterile dressing was applied. Post-op soaking instructions were dispensed. Patient tolerated the procedure well. The patient will follow-up in the office 1 week or sooner with any problems or complications.        Future Appt.: Scheduled Future Appointments       Provider Department Dept Phone Center   08/30/2024 10:00 AM Elveria Fireman [facility]  - [place] [facility] [phone] [facility] [address]   09/18/2024 11:00 AM Talitha Flatten [facility]  - Internal Medicine [facility] [phone] [facility]   09/25/2024 9:45 AM Lamar JINNY Sar [facility]  - [place] [phone] [facility] MP [address] [place]   03/18/2025 10:20 AM Talitha Flatten [facility]  - Internal Medicine [facility] [phone] [facility]

## 2024-09-18 NOTE — Progress Notes (Signed)
 [facility]  - Internal Medicine [facility]  [address] [place] [address]                                      09/18/24 Patient name: Theodore Gutierrez  Date of birth: 1956/10/08   SUBJECTIVE:  Chief Complaint  Patient presents with   Follow-up    Mr. Gurry is a 68 y.o. male comes in today for follow up of HTN, Lipids. Pt has completed fasting labs.  Hypertension : patient is taking blood pressure medications as prescribed. Irbesartan  300 mg daily. No side effects noted of medications. Patient is exercising daily and doing well. Patient is not compliant with low salt diet. Patient is checking blood pressure regularly.  Hyperlipidemia: Patient takes medication rosuvastatin  40 mg daily, fenofibrate  160 mg daily as directed, no side effects.  Compliant with low fat diet : Yes  He broke her left ankle last year, He follows with podiatry. He exercises regularly.  He would like to postpone pneumococcal vaccine.  He denies any fever, chills, dizziness, headache, blurred vision. No SOB, chest pain, palpitation. No nausea, vomiting, or abdominal pain.  BP Readings from Last 3 Encounters:  09/18/24 123/80  07/26/24 134/89  04/03/24 126/90    Lab Results  Component Value Date   HGBA1C 5.6 09/18/2024   HGBA1C 5.5 09/03/2022   HGBA1C 5.5 09/03/2022    Lab Results  Component Value Date   LDLCALC 104 (H) 09/13/2024   LDLCALC 105 (H) 04/03/2024    Lab Results  Component Value Date   CHOL 179 09/13/2024   HDL 58 (L) 09/13/2024   LDLCALC 104 (H) 09/13/2024   TRIG 79 09/13/2024      The 10-year ASCVD risk score (Arnett DK, et al., 2019) is: 12.8%   Values used to calculate the score:     Age: 80 years     Clinically relevant sex: Male     Is Non-Hispanic African American: No     Diabetic: No     Tobacco smoker: No     Systolic Blood Pressure: 123 mmHg     Is BP treated: Yes     HDL Cholesterol: 58 mg/dL      Total Cholesterol: 179 mg/dL  HISTORY: Ihave reviewed the allergies, current medications, past medical and surgical history, family and social history, problem list, and updated as needed.  Allergies Allergies[1]  Medications Current Medications[2]  Family history Family History[3]  Social history Tobacco Use History[4]  Surgical history Surgical History[5]  Preventive Care Health Maintenance  Topic Date Due   Hepatitis C Screening  Never done   Pneumococcal Vaccine for Ages 67+ (3 of 3 - PCV20 or PCV21) 12/14/2020   Comprehensive Annual Visit  03/16/2025   Depression Monitoring  03/18/2025   Diabetes Screening  09/18/2025   DTaP/Tdap/Td Vaccines (2 - Td or Tdap) 12/10/2026   Colorectal Cancer Screening  02/21/2028   Adult RSV (60+ Years or Pregnancy) (1 - 1-dose 75+ series) 02/01/2033   Medicare Annual Wellness Visit:  Medicare Advantage  Completed   Influenza Vaccine  Completed   ZOSTER VACCINE  Completed   COVID-19 Vaccine  Completed   HIB Vaccines  Aged Out   Hepatitis B Vaccines  Aged Out   IPV Vaccines  Aged Out   Hepatitis A Vaccines  Aged Out   Meningococcal Conjugate (ACWY) Vaccine  Aged  Out   Rotavirus Vaccines  Aged Out   HPV Vaccines  Aged Out   Meningococcal B Vaccine  Aged Out   Depression Screening  Discontinued     Immunization History  Administered Date(s) Administered   Hep B, Adolescent or Pediatric 12/10/2006, 02/10/2017, 06/10/2017   Hepatitis A pediatric/adolescent (VAQTA PEDS) 1Y-18Y 12/10/2016, 06/10/2017   Influenza, Injectable, Quadrivalent, Preservative Free 09/08/2020, 09/07/2022   Influenza, Unspecified 08/23/2016, 08/27/2018, 09/08/2018, 09/05/2021   Influenza, split virus, trivalent, preservative 09/05/2017   Influenza,split virus, trivalent, PF 08/23/2016   Janssen Sars-CoV-2 Vaccination 03/07/2020   Moderna Covid-19, mRNA,LNP-S,PF 12+ Yrs 10/15/2022   Moderna SARS-CoV-2 Bivalent Booster 6+ yrs  09/07/2021   Moderna SARS-CoV-2 Primary Series 12+ yrs 04/14/2021   Pfizer SARS-CoV-2 Primary Series 12+ yrs 10/22/2020   Pneumococcal Conjugate 13-Valent 12/15/2015   Pneumococcal Polysaccharide Vaccine, 23 Valent (PNEUMOVAX-23) 2Y+ 07/18/2013   TDAP VACCINE (BOOSTRIX,ADACEL) 7Y+ 12/10/2016   Varicella Zoster (SHINGRIX) 18Y+ 06/03/2015, 06/10/2017   Zoster, Live 09/11/2014     ROS  Review of Systems  Constitutional:  Negative for appetite change, chills and fever.  Respiratory:  Negative for chest tightness, shortness of breath and wheezing.   Cardiovascular:  Negative for chest pain and palpitations.  Gastrointestinal:  Negative for abdominal pain, blood in stool, diarrhea, nausea and vomiting.  Genitourinary:  Negative for difficulty urinating, dysuria, flank pain and hematuria.       Genital pain, has appt with GU.   Musculoskeletal:        Pain in left foot and left ankle, he follows with podiatry.   Skin:  Negative for rash and wound.  Neurological:  Negative for dizziness, seizures, syncope and headaches.  Psychiatric/Behavioral:  Negative for agitation, behavioral problems, confusion, self-injury and suicidal ideas.   All other systems reviewed and are negative.   Review of Systems - All other systems reviewed are negative except as noted above.  OBJECTIVE  BP 123/80 (BP Location: Left arm, Patient Position: Sitting)   Pulse 67   Ht 1.829 m (6')   Wt 83.9 kg (185 lb)   SpO2 98%   BMI 25.09 kg/m    PHYSICAL:  Physical Exam Vitals and nursing note reviewed.  Constitutional:      General: He is not in acute distress.    Appearance: Normal appearance.  HENT:     Head: Normocephalic and atraumatic.  Eyes:     Pupils: Pupils are equal, round, and reactive to light.  Neck:     Vascular: No carotid bruit.  Cardiovascular:     Rate and Rhythm: Normal rate and regular rhythm.     Heart sounds: No murmur heard.    No friction rub. No gallop.  Pulmonary:      Effort: Pulmonary effort is normal. No respiratory distress.     Breath sounds: No wheezing, rhonchi or rales.  Abdominal:     General: Bowel sounds are normal.     Palpations: Abdomen is soft.     Tenderness: There is no abdominal tenderness.  Musculoskeletal:     Cervical back: Normal range of motion and neck supple.     Right lower leg: No edema.     Comments: Left ankle in brace.   Skin:    General: Skin is warm.     Findings: No rash.  Neurological:     General: No focal deficit present.     Mental Status: He is alert and oriented to person, place, and time.  Psychiatric:  Mood and Affect: Mood normal.        Behavior: Behavior normal.     ASSESSMENT/PLAN:  1. Elevated blood sugar (Primary) Fasting blood sugar was high, will check A1c. - Hemoglobin A1C With Estimated Average Glucose; Future  2. Benign hypertension BP Readings from Last 3 Encounters:  09/18/24 123/80  07/26/24 134/89  04/03/24 126/90  BP is well-controlled, sinew irbesartan .  3. Hypercholesterolemia Lab Results  Component Value Date   LDLCALC 104 (H) 09/13/2024  The 10-year ASCVD risk score (Arnett DK, et al., 2019) is: 12.8% Sinew lifestyle modification, continue rosuvastatin  and fenofibrate .  4. Anxiety state Follow-up with behavioral health.  5. Hyponatremia Lab Results  Component Value Date   CREATININE 0.77 09/13/2024   BUN 18 09/13/2024   NA 139 09/13/2024   K 4.3 09/13/2024   CL 103 09/13/2024   CO2 27 09/13/2024  In normal range now.  Good.  6. Penile pain, chronic Follow-up with urology as scheduled.  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   Goals of care discussed with patient including medication compliance and adequate follow up. Patient verbalizesunderstanding and in agreement with the above plan. All questions answered.   I agree the documentation is accurate and complete.   Future Appointments  Date Time Provider Department Center  10/02/2024  10:00 AM Seena Moose Levers III, PA-C [facility] URO MPE [facility] [address]  11/19/2024 10:20 AM Elveria Melvenia Medicine, NP [facility] BH EME [facility] [address]  03/18/2025 10:20 AM Talitha Flatten, MD [facility] PC PRE [facility]   This document serves as a record of services personally performed by Dr. Flatten.  It was created on their behalf by Ronal LITTIE Ring, CMA, a trained medical scribe, and Certified Medical Assistant (CMA). During the course of documenting the history, physical exam and medical decision making, I was functioning as a stage manager. The creation of this record is the providers dictation and/or activities during the visit.     This document was created using the aidof voice recognition Scientist, clinical (histocompatibility and immunogenetics).       [1] Allergies Allergen Reactions   Hydrocodone     Oxycodone  Itching  [2] Current Outpatient Medications  Medication Sig Dispense Refill   aspirin  81 mg chewable tablet Take 81 mg by mouth Once Daily.     cholecalciferol (VITAMIN D3) 1,000 unit (25 mcg) tablet Take 1,000 Units by mouth Once Daily.     clonazePAM  (KlonoPIN ) 1 mg tablet Take 1 tablet (1 mg total) by mouth 3 (three) times a day as needed for anxiety Indications: psychiatric disorder. 90 tablet 2   fenofibrate  (LOFIBRA) 160 mg tablet Take 1 tablet (160 mg total) by mouth daily. 30 tablet 11   irbesartan  (AVAPRO ) 300 mg tablet Take 1 tablet (300 mg total) by mouth daily. 30 tablet 11   lamoTRIgine  (LaMICtal ) 100 mg tablet Indications: psychiatric disorder. Take one tablet at bedtime 90 tablet 0   metoprolol  tartrate (LOPRESSOR ) 25 mg tablet Take 1 tablet (25 mg total) by mouth 2 (two) times a day. 180 tablet 1   PARoxetine  (PAXIL ) 10 mg tablet Take 1 tablet (10 mg total) by mouth daily. 90 tablet 0   PARoxetine  (PAXIL ) 40 mg tablet Take 1 tablet (40 mg total) by mouth daily. 90 tablet 0   phenytoin  (DILANTIN ) 100 mg ER capsule Taking two tablets in morning and three tablets in evening 150 capsule 9    rosuvastatin  (CRESTOR ) 40 mg tablet Take 1 tablet (40 mg total) by mouth daily.  30 tablet 11   tadalafiL (CIALIS) 20 mg tablet 1 po 4 hrs before planned intercourse 10 tablet 11   valACYclovir  (VALTREX ) 1 gram tablet Take 1 tablet (1,000 mg total) by mouth daily Indications: herpes simplex infection. 90 tablet 3   No current facility-administered medications for this visit.  [3] Family History Problem Relation Name Age of Onset   Diabetes Mother     Hypertension Mother     Stroke Mother     Stroke Father     Hypertension Father     OCD Brother Arley   [4] Social History Tobacco Use  Smoking Status Never  Smokeless Tobacco Never  [5] Past Surgical History: Procedure Laterality Date   RETINAL LASER PROCEDURE Left    Procedure: RETINAL LASER PROCEDURE; retinal tear   TONSILLECTOMY     Procedure: TONSILLECTOMY   WISDOM TOOTH EXTRACTION     Procedure: WISDOM TOOTH EXTRACTION

## 2024-12-10 ENCOUNTER — Encounter: Payer: Self-pay | Admitting: Physical Therapy

## 2024-12-10 ENCOUNTER — Other Ambulatory Visit: Payer: Self-pay

## 2024-12-10 ENCOUNTER — Ambulatory Visit: Attending: Urology | Admitting: Physical Therapy

## 2024-12-10 DIAGNOSIS — R293 Abnormal posture: Secondary | ICD-10-CM | POA: Diagnosis present

## 2024-12-10 DIAGNOSIS — M6281 Muscle weakness (generalized): Secondary | ICD-10-CM | POA: Diagnosis present

## 2024-12-10 DIAGNOSIS — M62838 Other muscle spasm: Secondary | ICD-10-CM | POA: Diagnosis present

## 2024-12-10 NOTE — Therapy (Signed)
OUTPATIENT PHYSICAL THERAPY MALE PELVIC EVALUATION   Patient Name: Theodore Gutierrez MRN: [id number] DOB:05/22/1956, 68 y.o., male Today's Date: 12/10/2024  END OF SESSION:  PT End of Session - 12/10/24 0852     Visit Number 1    Date for Recertification  06/10/25    Authorization Type humana    PT Start Time 0847    PT Stop Time 0923    PT Time Calculation (min) 36 min    Activity Tolerance Patient tolerated treatment well    Behavior During Therapy [facility] for tasks assessed/performed          Past Medical History:  Diagnosis Date   Anxiety    Depression    Diabetes mellitus without complication (HCC)    no longer on medication   Hyperlipidemia    Hypertension    OCD (obsessive compulsive disorder)    per patient   Seizures (HCC)    Past Surgical History:  Procedure Laterality Date   ORIF ANKLE FRACTURE Left 12/02/2023   Procedure: OPEN REDUCTION INTERNAL FIXATION (ORIF) ANKLE FRACTURE;  Surgeon: Kendal Franky SQUIBB, MD;  Location: [facility] OR;  Service: Orthopedics;  Laterality: Left;   TONSILLECTOMY     68 years old   Patient Active Problem List   Diagnosis Date Noted   Seizure (HCC) 03/20/2024   Closed displaced trimalleolar fracture of right ankle 12/02/2023   Closed displaced trimalleolar fracture of left ankle 12/02/2023   Penile pain, chronic 12/09/2016   Anxiety state 12/09/2016   Dysuria 11/25/2016   Breakthrough seizure (HCC) 02/21/2016   HTN (hypertension) 02/21/2016   Hyperlipemia 02/21/2016   Seizure disorder (HCC) 02/21/2016   Seizures (HCC) 02/21/2016    PCP: Cesario Mutton, MD   REFERRING PROVIDER: Feliciano Seena SAILOR, PA-C   REFERRING DIAG: penile pain  THERAPY DIAG:  Muscle weakness (generalized)  Abnormal posture  Other muscle spasm  Rationale for Evaluation and Treatment: Rehabilitation  ONSET DATE: 9 years  SUBJECTIVE:                                                                                                                                                                                            SUBJECTIVE STATEMENT: Has been having chronic pain at the penis was originally much worse but now happening around 2-3x weekly. 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 Does take valtrex  daily but hasn't seen improvement. No concerns with sexual function, does have some increase in urinary frequency, and no concerns with bowel movements.   Fluid intake: water - 32 oz, 1 cup of coffee  PAIN:  Are you having pain? Yes NPRS scale: 0-6/10 Pain location: penis  Pain type: stinging sensation at tip of penis, shaft can also be painful  Pain description: intermittent, burning  Aggravating factors: stress,  Relieving factors: ice, lidocaine  cream  PRECAUTIONS: None  RED FLAGS: None   WEIGHT BEARING RESTRICTIONS: No  FALLS:  Has patient fallen in last 6 months? No  LIVING ENVIRONMENT: Lives with: lives with brother   OCCUPATION: remote, sales for [employer]   PLOF: Independent  PATIENT GOALS: to have less pain  PERTINENT HISTORY:  Penile pain, HSV, anxiety, depression, DM, OCD, seizures, Lt ORIF ankle Sexual abuse: NO   BOWEL MOVEMENT: No concerns  URINATION: Pain with urination: No Fully empty bladder: Yes:   Stream: Strong Urgency: Yes:   Frequency: about every 2 hours, 1-2x night  Leakage: Urge to void Pads: No  INTERCOURSE: No concerns   OBJECTIVE:  Note: Objective measures were completed at Evaluation unless otherwise noted.  DIAGNOSTIC FINDINGS:    PATIENT SURVEYS:  Urogenital Distress Inventory (UDI-6 Short Form) Score = 33  PFIQ-7 24 bladder  COGNITION: Overall cognitive status: Within functional limits for tasks assessed     SENSATION: Light touch: Appears intact Proprioception: Appears intact  MUSCLE LENGTH:    GAIT: Distance walked: 150' Assistive device utilized: None Level of assistance: Complete  Independence Comments: decreased cadence, lt LE decreased step height and length, shuffle reports he has history of broken ankle and this has limited his gait mechanics but greatly improved   POSTURE: rounded shoulders and forward head   LUMBARAROM/PROM:  A/PROM A/PROM  eval  Flexion 75  Extension 100  Right lateral flexion 75  Left lateral flexion 75  Right rotation 75  Left rotation 75   (Blank rows = not tested)  LOWER EXTREMITY AROM/PROM:  Bil hamstrings and adductors limited by 25%  LOWER EXTREMITY MMT:  Ankle lt in brace and not removed for MMTs will assess more if needed at future treatment. But hips grossly 3+/5  PALPATION: GENERAL mild tension in lower abdomen, tightness in lumbar paraspinals bu no TTP              External Perineal Exam deferred               Internal Pelvic Floor deferred  Patient confirms identification and approves PT to assess internal pelvic floor and treatment No  PELVIC MMT:   MMT eval  Internal Anal Sphincter   External Anal Sphincter   Puborectalis   Diastasis Recti   (Blank rows = not tested)  TONE: Deferred   TODAY'S TREATMENT:                                                                                                                              DATE:   12/10/24 EVAL Examination completed, findings reviewed, pt educated on POC, Pt motivated to participate in PT and agreeable to attempt recommendations.  No treatment billed  as pt awaiting referral to be sent to clinic.    PATIENT EDUCATION:  Education details: [id number] Person educated: Patient Education method: Explanation, Demonstration, Tactile cues, Verbal cues, and Handouts Education comprehension: verbalized understanding, returned demonstration, verbal cues required, tactile cues required, and needs further education  HOME EXERCISE PROGRAM: [id number]  ASSESSMENT:  CLINICAL IMPRESSION: Patient is a 68 y.o. male   who was seen today for physical therapy  evaluation and treatment for chronic penile pain. Pt has history of HSV-1 and reports he thinks his pain is from this but medication not helping. Pt does see worsening of pain with stress and does improve with walking per pt. Pt demonstrates impaired gait mechanics, impaired posture, decreased core and hip strength, decreased stability in single leg stance but limited with previous Lt ankle ORIF, mild tightness in abdomen and lumbar spine but without pain. Pt denies concerns with sexual function or bowel function but does report increased urinary frequency. Pt briefly educated on pelvic floor anatomy with muscle and nerves for improved understanding of this as pt reports he wasn't clear when doctor discussed this. Pt deferred internal assessment but open to this if needed at future appointment. Pt would benefit from additional PT to further address deficits.    OBJECTIVE IMPAIRMENTS: decreased activity tolerance, decreased coordination, decreased mobility, decreased strength, increased fascial restrictions, increased muscle spasms, impaired flexibility, improper body mechanics, postural dysfunction, and pain.   ACTIVITY LIMITATIONS: nothing per pt  PARTICIPATION LIMITATIONS: nothing per pt  PERSONAL FACTORS: Fitness, Time since onset of injury/illness/exacerbation, and 1 comorbidity: HSV-1 are also affecting patient's functional outcome.   REHAB POTENTIAL: Good  CLINICAL DECISION MAKING: Stable/uncomplicated  EVALUATION COMPLEXITY: Low   GOALS: Goals reviewed with patient? Yes  SHORT TERM GOALS: Target date: 01/07/25  Pt to be I with HEP for carry over and continuing recommendations for improved outcomes.   Baseline: Goal status: INITIAL  2.  Pt I with relaxation techniques for decreased tightening of pelvic floor to decreased pain. Baseline:  Goal status: INITIAL  3.  Pt to be I with abdominal massage and self manual at pelvic floor for improved pain management.  Baseline:  Goal  status: INITIAL  4. Pt will be independent with the urge suppression technique, and double voiding in order to improve bladder habits and decrease urinary incontinence.   Baseline:  Goal status: INITIAL  LONG TERM GOALS: Target date: 06/10/25  Pt to be I with advanced HEP for carry over and continuing recommendations for improved outcomes.   Baseline:  Goal status: INITIAL  2.  Pt to report at least 50% decrease in symptoms for improved QOL.  Baseline: 2-3 x weekly Goal status: INITIAL  3.  Pt to report improved time between bladder voids to at least 3 hours for improved QOL with decreased urinary frequency.   Baseline:  Goal status: INITIAL    PLAN:  PT FREQUENCY: 1x/week  PT DURATION: 8 sessions  PLANNED INTERVENTIONS: 97110-Therapeutic exercises, 97530- Therapeutic activity, 97112- Neuromuscular re-education, 97535- Self Care, 02859- Manual therapy, [id number]- Canalith repositioning, [id number]- Aquatic Therapy, [phone]- Electrical stimulation (manual), [id number]- Vasopneumatic device, [phone] (1-2 muscles), [id number] (3+ muscles)- Dry Needling, Patient/Family education, Taping, Joint mobilization, Spinal mobilization, Scar mobilization, DME instructions, Cryotherapy, Moist heat, and Biofeedback  PLAN FOR NEXT SESSION: internal if needed and pt consents, stretching hips and low back, relaxation techniques, urge drill   Darryle Navy, PT, DPT 12/15/202511:51 AM  [facility] [address], [address] [place], [place] [address] Phone # [phone] Fax  [phone]  °

## 2024-12-11 NOTE — Addendum Note (Signed)
 Addended by: Aissata Wilmore S on: 12/11/2024 03:57 PM   Modules accepted: Orders

## 2025-01-15 ENCOUNTER — Ambulatory Visit: Attending: Urology | Admitting: Physical Therapy

## 2025-01-15 DIAGNOSIS — R293 Abnormal posture: Secondary | ICD-10-CM | POA: Diagnosis present

## 2025-01-15 DIAGNOSIS — M62838 Other muscle spasm: Secondary | ICD-10-CM | POA: Diagnosis present

## 2025-01-15 DIAGNOSIS — M6281 Muscle weakness (generalized): Secondary | ICD-10-CM | POA: Insufficient documentation

## 2025-01-15 NOTE — Patient Instructions (Addendum)
[email or web address]   

## 2025-01-15 NOTE — Therapy (Signed)
 " OUTPATIENT PHYSICAL THERAPY MALE PELVIC TREATMENT   Patient Name: Theodore Gutierrez MRN: [id number] DOB:1956-11-28, 69 y.o., male Today's Date: 01/15/2025  END OF SESSION:  PT End of Session - 01/15/25 0939     Visit Number 2    Date for Recertification  06/10/25    Authorization Type humana    PT Start Time 0932    PT Stop Time 1013    PT Time Calculation (min) 41 min    Activity Tolerance Patient tolerated treatment well    Behavior During Therapy WFL for tasks assessed/performed           Past Medical History:  Diagnosis Date   Anxiety    Depression    Diabetes mellitus without complication (HCC)    no longer on medication   Hyperlipidemia    Hypertension    OCD (obsessive compulsive disorder)    per patient   Seizures (HCC)    Past Surgical History:  Procedure Laterality Date   ORIF ANKLE FRACTURE Left 12/02/2023   Procedure: OPEN REDUCTION INTERNAL FIXATION (ORIF) ANKLE FRACTURE;  Surgeon: Kendal Franky SQUIBB, MD;  Location: [facility] OR;  Service: Orthopedics;  Laterality: Left;   TONSILLECTOMY     69 years old   Patient Active Problem List   Diagnosis Date Noted   Seizure (HCC) 03/20/2024   Closed displaced trimalleolar fracture of right ankle 12/02/2023   Closed displaced trimalleolar fracture of left ankle 12/02/2023   Penile pain, chronic 12/09/2016   Anxiety state 12/09/2016   Dysuria 11/25/2016   Breakthrough seizure (HCC) 02/21/2016   HTN (hypertension) 02/21/2016   Hyperlipemia 02/21/2016   Seizure disorder (HCC) 02/21/2016   Seizures (HCC) 02/21/2016    PCP: Cesario Mutton, MD   REFERRING PROVIDER: Feliciano Seena SAILOR, PA-C   REFERRING DIAG: penile pain  THERAPY DIAG:  Muscle weakness (generalized)  Abnormal posture  Other muscle spasm  Rationale for Evaluation and Treatment: Rehabilitation  ONSET DATE: 9 years  SUBJECTIVE:                                                                                                                                                                                            SUBJECTIVE STATEMENT: Has been having much less pain in the last two weeks, has been doing walking 20-25 mins 3-4x week. And doing HEP sometimes.  In the last 2 weeks, worst day was yesterday and did use Vaseline (at end of penis, felt stinging sensation but went from 7/10 to 3/10 with vaseline use)   Has been having chronic pain at the penis was originally much worse but now happening around 2-3x weekly.  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 Does take valtrex  daily but hasn't seen improvement. No concerns with sexual function, does have some increase in urinary frequency, and no concerns with bowel movements.   Fluid intake: water - 32 oz, 1 cup of coffee   PAIN:  Are you having pain? Yes NPRS scale: 2/10 (current) Pain location: penis  Pain type: stinging sensation at tip of penis, shaft can also be painful  Pain description: intermittent, burning  Aggravating factors: stress,  Relieving factors: ice, lidocaine  cream  PRECAUTIONS: None  RED FLAGS: None   WEIGHT BEARING RESTRICTIONS: No  FALLS:  Has patient fallen in last 6 months? No  LIVING ENVIRONMENT: Lives with: lives with brother   OCCUPATION: remote, sales for [employer]   PLOF: Independent  PATIENT GOALS: to have less pain  PERTINENT HISTORY:  Penile pain, HSV, anxiety, depression, DM, OCD, seizures, Lt ORIF ankle Sexual abuse: NO   BOWEL MOVEMENT: No concerns  URINATION: Pain with urination: No Fully empty bladder: Yes:   Stream: Strong Urgency: Yes:   Frequency: about every 2 hours, 1-2x night  Leakage: Urge to void Pads: No  INTERCOURSE: No concerns   OBJECTIVE:  Note: Objective measures were completed at Evaluation unless otherwise noted.  DIAGNOSTIC FINDINGS:    PATIENT SURVEYS:  Urogenital Distress Inventory (UDI-6 Short Form) Score = 33  PFIQ-7  24 bladder  COGNITION: Overall cognitive status: Within functional limits for tasks assessed     SENSATION: Light touch: Appears intact Proprioception: Appears intact  MUSCLE LENGTH:    GAIT: Distance walked: 150' Assistive device utilized: None Level of assistance: Complete Independence Comments: decreased cadence, lt LE decreased step height and length, shuffle reports he has history of broken ankle and this has limited his gait mechanics but greatly improved   POSTURE: rounded shoulders and forward head   LUMBARAROM/PROM:  A/PROM A/PROM  eval  Flexion 75  Extension 100  Right lateral flexion 75  Left lateral flexion 75  Right rotation 75  Left rotation 75   (Blank rows = not tested)  LOWER EXTREMITY AROM/PROM:  Bil hamstrings and adductors limited by 25%  LOWER EXTREMITY MMT:  Ankle lt in brace and not removed for MMTs will assess more if needed at future treatment. But hips grossly 3+/5  PALPATION: GENERAL mild tension in lower abdomen, tightness in lumbar paraspinals bu no TTP              External Perineal Exam deferred               Internal Pelvic Floor deferred  Patient confirms identification and approves PT to assess internal pelvic floor and treatment No  PELVIC MMT:   MMT eval  Internal Anal Sphincter   External Anal Sphincter   Puborectalis   Diastasis Recti   (Blank rows = not tested)  TONE: Deferred 01/15/25  TODAY'S TREATMENT:  DATE:   12/10/24 EVAL Examination completed, findings reviewed, pt educated on POC, Pt motivated to participate in PT and agreeable to attempt recommendations.  No treatment billed as pt awaiting referral to be sent to clinic.   01/15/25: Pt educated on male pelvic floor anatomy and possible benefits for internal assessment pt deferred today but would like to do this next session  Pt  educated on mindfulness and pelvic floor relaxation techniques daily - pt does report he feels pain gets higher when stressed and educated to attempt prior to situations that may cause tension to see if this helps with pain HEP updated to add reverse clam for improved pelvic floor lengthening 2 reps of all HEP as pt wanted to review to make sure he was doing them right at home   PATIENT EDUCATION:  Education details: [id number] Person educated: Patient Education method: Explanation, Demonstration, Tactile cues, Verbal cues, and Handouts Education comprehension: verbalized understanding, returned demonstration, verbal cues required, tactile cues required, and needs further education  HOME EXERCISE PROGRAM: [id number]  ASSESSMENT:  CLINICAL IMPRESSION: Patient is a 69 y.o. male   who was seen today for physical therapy  treatment for chronic penile pain. Pt has history of HSV-1,reports pain has been improving with walking regularly, HEP more frequently, but not resolved. Pt deferred internal assessment of pelvic floor until next appointment pending pt consent. Pt tolerated session well and benefited from extra time to explain all exercises, anatomy, and relaxation techniques to further help in decreased pain. Pt had several questions with all above, and all answered. Pt would benefit from additional PT to further address deficits.    OBJECTIVE IMPAIRMENTS: decreased activity tolerance, decreased coordination, decreased mobility, decreased strength, increased fascial restrictions, increased muscle spasms, impaired flexibility, improper body mechanics, postural dysfunction, and pain.   ACTIVITY LIMITATIONS: nothing per pt  PARTICIPATION LIMITATIONS: nothing per pt  PERSONAL FACTORS: Fitness, Time since onset of injury/illness/exacerbation, and 1 comorbidity: HSV-1 are also affecting patient's functional outcome.   REHAB POTENTIAL: Good  CLINICAL DECISION MAKING:  Stable/uncomplicated  EVALUATION COMPLEXITY: Low   GOALS: Goals reviewed with patient? Yes  SHORT TERM GOALS: Target date: 01/07/25  Pt to be I with HEP for carry over and continuing recommendations for improved outcomes.   Baseline: Goal status: INITIAL  2.  Pt I with relaxation techniques for decreased tightening of pelvic floor to decreased pain. Baseline:  Goal status: INITIAL  3.  Pt to be I with abdominal massage and self manual at pelvic floor for improved pain management.  Baseline:  Goal status: INITIAL  4. Pt will be independent with the urge suppression technique, and double voiding in order to improve bladder habits and decrease urinary incontinence.   Baseline:  Goal status: INITIAL  LONG TERM GOALS: Target date: 06/10/25  Pt to be I with advanced HEP for carry over and continuing recommendations for improved outcomes.   Baseline:  Goal status: INITIAL  2.  Pt to report at least 50% decrease in symptoms for improved QOL.  Baseline: 2-3 x weekly Goal status: INITIAL  3.  Pt to report improved time between bladder voids to at least 3 hours for improved QOL with decreased urinary frequency.   Baseline:  Goal status: INITIAL    PLAN:  PT FREQUENCY: 1x/week  PT DURATION: 8 sessions  PLANNED INTERVENTIONS: 97110-Therapeutic exercises, 97530- Therapeutic activity, [id number]- Neuromuscular re-education, 97535- Self Care, 02859- Manual therapy, [id number]- Canalith repositioning, [id number]- Aquatic Therapy, [id number]- Electrical stimulation (manual), [id number]- Vasopneumatic device, [id number] (1-2 muscles),  [id number] (3+ muscles)- Dry Needling, Patient/Family education, Taping, Joint mobilization, Spinal mobilization, Scar mobilization, DME instructions, Cryotherapy, Moist heat, and Biofeedback  PLAN FOR NEXT SESSION: internal if needed and pt consents, stretching hips and low back, relaxation techniques, urge drill   Darryle Navy, PT, DPT 01/15/2609:28 AM  [facility] [address] [place], [place] [address] Phone # [phone] Fax [phone]  "

## 2025-01-29 ENCOUNTER — Ambulatory Visit: Admitting: Physical Therapy

## 2025-01-29 DIAGNOSIS — M62838 Other muscle spasm: Secondary | ICD-10-CM

## 2025-01-29 DIAGNOSIS — R293 Abnormal posture: Secondary | ICD-10-CM

## 2025-01-29 DIAGNOSIS — M6281 Muscle weakness (generalized): Secondary | ICD-10-CM

## 2025-02-04 ENCOUNTER — Ambulatory Visit: Admitting: Physical Therapy

## 2025-02-05 ENCOUNTER — Ambulatory Visit: Admitting: Physical Therapy

## 2025-02-11 ENCOUNTER — Ambulatory Visit: Admitting: Physical Therapy

## 2025-02-18 ENCOUNTER — Ambulatory Visit: Admitting: Physical Therapy
